# Patient Record
Sex: Male | Born: 1970
Health system: Southern US, Community
[De-identification: ages and names within clinical notes are randomized; demographics above are authoritative.]

## PROBLEM LIST (undated history)

## (undated) ENCOUNTER — Ambulatory Visit: Admission: EM | Payer: 59 | Source: Home / Self Care

## (undated) DIAGNOSIS — F419 Anxiety disorder, unspecified: Secondary | ICD-10-CM

## (undated) HISTORY — DX: Anxiety disorder, unspecified: F41.9

---
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
## 1999-11-03 ENCOUNTER — Emergency Department (HOSPITAL_COMMUNITY): Admission: EM | Admit: 1999-11-03 | Discharge: 1999-11-03 | Payer: Self-pay | Admitting: Emergency Medicine

## 1999-11-03 ENCOUNTER — Encounter: Payer: Self-pay | Admitting: Emergency Medicine

## 2011-08-22 ENCOUNTER — Ambulatory Visit: Payer: PRIVATE HEALTH INSURANCE

## 2011-08-22 ENCOUNTER — Ambulatory Visit (INDEPENDENT_AMBULATORY_CARE_PROVIDER_SITE_OTHER): Payer: PRIVATE HEALTH INSURANCE | Admitting: Family Medicine

## 2011-08-22 VITALS — BP 125/84 | HR 76 | Temp 98.6°F | Resp 18 | Ht 72.0 in | Wt 214.0 lb

## 2011-08-22 DIAGNOSIS — M25561 Pain in right knee: Secondary | ICD-10-CM

## 2011-08-22 DIAGNOSIS — M2391 Unspecified internal derangement of right knee: Secondary | ICD-10-CM

## 2011-08-22 DIAGNOSIS — M2390 Unspecified internal derangement of unspecified knee: Secondary | ICD-10-CM
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
## 2012-08-24 ENCOUNTER — Ambulatory Visit (INDEPENDENT_AMBULATORY_CARE_PROVIDER_SITE_OTHER): Payer: PRIVATE HEALTH INSURANCE | Admitting: Emergency Medicine

## 2012-08-24 VITALS — BP 138/90 | HR 71 | Temp 98.3°F | Resp 16 | Ht 73.0 in | Wt 209.0 lb

## 2012-08-24 DIAGNOSIS — G5680 Other specified mononeuropathies of unspecified upper limb: Secondary | ICD-10-CM

## 2012-08-24 DIAGNOSIS — G5682 Other specified mononeuropathies of left upper limb: Secondary | ICD-10-CM

## 2012-08-24 MED ORDER — HYDROCODONE-ACETAMINOPHEN 5-325 MG PO TABS: 1.0000 | ORAL_TABLET | ORAL | Status: DC | PRN

## 2012-08-24 MED ORDER — NAPROXEN SODIUM 550 MG PO TABS: 550.0000 mg | ORAL_TABLET | Freq: Two times a day (BID) | ORAL | Status: AC

## 2012-08-24 MED ORDER — CYCLOBENZAPRINE HCL 10 MG PO TABS: 10.0000 mg | ORAL_TABLET | Freq: Three times a day (TID) | ORAL | Status: DC | PRN

## 2012-08-24 NOTE — Progress Notes (Signed)
[facility] [address], [place] [address] [phone]  Date:  08/24/2012   Name:  Arthur Taylor   DOB:  [birth year]   MRN:  [id number]  PCP:  No primary provider on file.    Chief Complaint: Back Pain   History of Present Illness:  Arthur Taylor is a 42 y.o. very pleasant male patient who presents with the following:  Pain in left upper back medial to inferior pole scapula.  No history of injury. Developed after pulling up a floor and laying floor tile.  Denies shortness of breath or cough.  No fever or chills.  No hemoptysis.  Pain for two weeks.  Pain worse with deep breathing, sneezing or coughing.  No improvement with over the counter medications or other home remedies. Denies other complaint or health concern today.   There is no problem list on file for this patient.   No past medical history on file.  No past surgical history on file.  History  Substance Use Topics  . Smoking status: Passive Smoke Exposure - Never Smoker  . Smokeless tobacco: Not on file  . Alcohol Use: No    No family history on file.  No Known Allergies  Medication list has been reviewed and updated.  No current outpatient prescriptions on file prior to visit.   No current facility-administered medications on file prior to visit.    Review of Systems:  As per HPI, otherwise negative.    Physical Examination: Filed Vitals:   08/24/12 1206  BP: 138/90  Pulse: 71  Temp: 98.3 F (36.8 C)  Resp: 16   Filed Vitals:   08/24/12 1206  Height: 6\' 1"  (1.854 m)  Weight: 209 lb (94.802 kg)   Body mass index is 27.58 kg/(m^2). Ideal Body Weight: Weight in (lb) to have BMI = 25: 189.1  GEN: WDWN, NAD, Non-toxic, A & O x 3 HEENT: Atraumatic, Normocephalic. Neck supple. No masses, No LAD. Ears and Nose: No external deformity. CV: RRR, No M/G/R. No JVD. No thrill. No extra heart sounds. PULM: CTA B, no wheezes, crackles, rhonchi. No retractions. No resp.  distress. No accessory muscle use. ABD: S, NT, ND, +BS. No rebound. No HSM. EXTR: No c/c/e NEURO Normal gait.  PSYCH: Normally interactive. Conversant. Not depressed or anxious appearing.  Calm demeanor.  BACK:  Tender trigger point medial to scapula   Assessment and Plan: Scapulocostal syndrome Anaprox Flexeril vicodin Local heat Follow up as needed Signed,  Phillips Odor, MD

## 2012-08-24 NOTE — Patient Instructions (Addendum)
Back Pain, Adult Low back pain is very common. About 1 in 5 people have back pain.The cause of low back pain is rarely dangerous. The pain often gets better over time.About half of people with a sudden onset of back pain feel better in just 2 weeks. About 8 in 10 people feel better by 6 weeks.  CAUSES Some common causes of back pain include:  Strain of the muscles or ligaments supporting the spine.  Wear and tear (degeneration) of the spinal discs.  Arthritis.  Direct injury to the back. DIAGNOSIS Most of the time, the direct cause of low back pain is not known.However, back pain can be treated effectively even when the exact cause of the pain is unknown.Answering your caregiver's questions about your overall health and symptoms is one of the most accurate ways to make sure the cause of your pain is not dangerous. If your caregiver needs more information, he or she may order lab work or imaging tests (X-rays or MRIs).However, even if imaging tests show changes in your back, this usually does not require surgery. HOME CARE INSTRUCTIONS For many people, back pain returns.Since low back pain is rarely dangerous, it is often a condition that people can learn to manageon their own.   Remain active. It is stressful on the back to sit or stand in one place. Do not sit, drive, or stand in one place for more than 30 minutes at a time. Take short walks on level surfaces as soon as pain allows.Try to increase the length of time you walk each day.  Do not stay in bed.Resting more than 1 or 2 days can delay your recovery.  Do not avoid exercise or work.Your body is made to move.It is not dangerous to be active, even though your back may hurt.Your back will likely heal faster if you return to being active before your pain is gone.  Pay attention to your body when you bend and lift. Many people have less discomfortwhen lifting if they bend their knees, keep the load close to their bodies,and  avoid twisting. Often, the most comfortable positions are those that put less stress on your recovering back.  Find a comfortable position to sleep. Use a firm mattress and lie on your side with your knees slightly bent. If you lie on your back, put a pillow under your knees.  Only take over-the-counter or prescription medicines as directed by your caregiver. Over-the-counter medicines to reduce pain and inflammation are often the most helpful.Your caregiver may prescribe muscle relaxant drugs.These medicines help dull your pain so you can more quickly return to your normal activities and healthy exercise.  Put ice on the injured area.  Put ice in a plastic bag.  Place a towel between your skin and the bag.  Leave the ice on for 15 to 20 minutes, 3 to 4 times a day for the first 2 to 3 days. After that, ice and heat may be alternated to reduce pain and spasms.  Ask your caregiver about trying back exercises and gentle massage. This may be of some benefit.  Avoid feeling anxious or stressed.Stress increases muscle tension and can worsen back pain.It is important to recognize when you are anxious or stressed and learn ways to manage it.Exercise is a great option. SEEK MEDICAL CARE IF:  You have pain that is not relieved with rest or medicine.  You have pain that does not improve in 1 week.  You have new symptoms.  You are generally   not feeling well. SEEK IMMEDIATE MEDICAL CARE IF:   You have pain that radiates from your back into your legs.  You develop new bowel or bladder control problems.  You have unusual weakness or numbness in your arms or legs.  You develop nausea or vomiting.  You develop abdominal pain.  You feel faint. Document Released: 05/16/2005 Document Revised: 11/15/2011 Document Reviewed: 10/04/2010 ExitCare Patient Information 2013 [employer].  

## 2014-03-17 ENCOUNTER — Ambulatory Visit (INDEPENDENT_AMBULATORY_CARE_PROVIDER_SITE_OTHER): Payer: BC Managed Care – PPO | Admitting: Family Medicine

## 2014-03-17 VITALS — BP 120/78 | HR 62 | Temp 98.2°F | Resp 16 | Ht 72.75 in | Wt 203.0 lb

## 2014-03-17 DIAGNOSIS — R05 Cough: Secondary | ICD-10-CM

## 2014-03-17 DIAGNOSIS — R059 Cough, unspecified: Secondary | ICD-10-CM

## 2014-03-17 DIAGNOSIS — J069 Acute upper respiratory infection, unspecified: Secondary | ICD-10-CM

## 2014-03-17 DIAGNOSIS — R0982 Postnasal drip: Secondary | ICD-10-CM

## 2014-03-17 MED ORDER — FLUTICASONE PROPIONATE 50 MCG/ACT NA SUSP: 2.0000 | Freq: Every day | NASAL | Status: DC

## 2014-03-17 MED ORDER — BENZONATATE 100 MG PO CAPS: 200.0000 mg | ORAL_CAPSULE | Freq: Two times a day (BID) | ORAL | Status: DC | PRN

## 2014-03-17 MED ORDER — AZITHROMYCIN 250 MG PO TABS: ORAL_TABLET | ORAL | Status: DC

## 2014-03-17 NOTE — Patient Instructions (Signed)
Upper Respiratory Infection, Adult An upper respiratory infection (URI) is also sometimes known as the common cold. The upper respiratory tract includes the nose, sinuses, throat, trachea, and bronchi. Bronchi are the airways leading to the lungs. Most people improve within 1 week, but symptoms can last up to 2 weeks. A residual cough may last even longer.  CAUSES Many different viruses can infect the tissues lining the upper respiratory tract. The tissues become irritated and inflamed and often become very moist. Mucus production is also common. A cold is contagious. You can easily spread the virus to others by oral contact. This includes kissing, sharing a glass, coughing, or sneezing. Touching your mouth or nose and then touching a surface, which is then touched by another person, can also spread the virus. SYMPTOMS  Symptoms typically develop 1 to 3 days after you come in contact with a cold virus. Symptoms vary from person to person. They may include:  Runny nose.  Sneezing.  Nasal congestion.  Sinus irritation.  Sore throat.  Loss of voice (laryngitis).  Cough.  Fatigue.  Muscle aches.  Loss of appetite.  Headache.  Low-grade fever. DIAGNOSIS  You might diagnose your own cold based on familiar symptoms, since most people get a cold 2 to 3 times a year. Your caregiver can confirm this based on your exam. Most importantly, your caregiver can check that your symptoms are not due to another disease such as strep throat, sinusitis, pneumonia, asthma, or epiglottitis. Blood tests, throat tests, and X-rays are not necessary to diagnose a common cold, but they may sometimes be helpful in excluding other more serious diseases. Your caregiver will decide if any further tests are required. RISKS AND COMPLICATIONS  You may be at risk for a more severe case of the common cold if you smoke cigarettes, have chronic heart disease (such as heart failure) or lung disease (such as asthma), or if  you have a weakened immune system. The very young and very old are also at risk for more serious infections. Bacterial sinusitis, middle ear infections, and bacterial pneumonia can complicate the common cold. The common cold can worsen asthma and chronic obstructive pulmonary disease (COPD). Sometimes, these complications can require emergency medical care and may be life-threatening. PREVENTION  The best way to protect against getting a cold is to practice good hygiene. Avoid oral or hand contact with people with cold symptoms. Wash your hands often if contact occurs. There is no clear evidence that vitamin C, vitamin E, echinacea, or exercise reduces the chance of developing a cold. However, it is always recommended to get plenty of rest and practice good nutrition. TREATMENT  Treatment is directed at relieving symptoms. There is no cure. Antibiotics are not effective, because the infection is caused by a virus, not by bacteria. Treatment may include:  Increased fluid intake. Sports drinks offer valuable electrolytes, sugars, and fluids.  Breathing heated mist or steam (vaporizer or shower).  Eating chicken soup or other clear broths, and maintaining good nutrition.  Getting plenty of rest.  Using gargles or lozenges for comfort.  Controlling fevers with ibuprofen or acetaminophen as directed by your caregiver.  Increasing usage of your inhaler if you have asthma. Zinc gel and zinc lozenges, taken in the first 24 hours of the common cold, can shorten the duration and lessen the severity of symptoms. Pain medicines may help with fever, muscle aches, and throat pain. A variety of non-prescription medicines are available to treat congestion and runny nose. Your caregiver   can make recommendations and may suggest nasal or lung inhalers for other symptoms.  HOME CARE INSTRUCTIONS   Only take over-the-counter or prescription medicines for pain, discomfort, or fever as directed by your  caregiver.  Use a warm mist humidifier or inhale steam from a shower to increase air moisture. This may keep secretions moist and make it easier to breathe.  Drink enough water and fluids to keep your urine clear or pale yellow.  Rest as needed.  Return to work when your temperature has returned to normal or as your caregiver advises. You may need to stay home longer to avoid infecting others. You can also use a face mask and careful hand washing to prevent spread of the virus. SEEK MEDICAL CARE IF:   After the first few days, you feel you are getting worse rather than better.  You need your caregiver's advice about medicines to control symptoms.  You develop chills, worsening shortness of breath, or brown or red sputum. These may be signs of pneumonia.  You develop yellow or brown nasal discharge or pain in the face, especially when you bend forward. These may be signs of sinusitis.  You develop a fever, swollen neck glands, pain with swallowing, or white areas in the back of your throat. These may be signs of strep throat. SEEK IMMEDIATE MEDICAL CARE IF:   You have a fever.  You develop severe or persistent headache, ear pain, sinus pain, or chest pain.  You develop wheezing, a prolonged cough, cough up blood, or have a change in your usual mucus (if you have chronic lung disease).  You develop sore muscles or a stiff neck. Document Released: 11/09/2000 Document Revised: 08/08/2011 Document Reviewed: 08/21/2013 ExitCare Patient Information 2015 [employer]. This information is not intended to replace advice given to you by your health care provider. Make sure you discuss any questions you have with your health care provider.  

## 2014-03-17 NOTE — Progress Notes (Signed)
Chief Complaint:  Chief Complaint  Patient presents with  . Cough    when take a breath feel like he has something in his throat, and make him coughing.    HPI: Arthur Taylor is a 43 y.o. male who is here for 1 week history of something in his throat and has a tickle in his throat. He also had a subjective fever and was sweating last week, he was still working.  He took some "horse pills" that he got from his GF. He has coughing still but it is dry. He He has very occ dark sputum. He feels he was sick last week and GF is worried he may have walkign pneumonia.  He has taking tussin. He took the "horse pills for 2.5 days" . He denis any facial pain, ear pain. He has sore throat.   History reviewed. No pertinent past medical history. Past Surgical History  Procedure Laterality Date  . Knee surgery  1988   History   Social History  . Marital Status: Single    Spouse Name: N/A    Number of Children: N/A  . Years of Education: N/A   Social History Main Topics  . Smoking status: Passive Smoke Exposure - Never Smoker  . Smokeless tobacco: None  . Alcohol Use: No  . Drug Use: No  . Sexual Activity: Yes   Other Topics Concern  . None   Social History Narrative  . None   History reviewed. No pertinent family history. No Known Allergies Prior to Admission medications   Not on File     ROS: The patient denies fevers, chills, night sweats, unintentional weight loss, chest pain, palpitations, wheezing, dyspnea on exertion, nausea, vomiting, abdominal pain, dysuria, hematuria, melena, numbness, weakness, or tingling.   All other systems have been reviewed and were otherwise negative with the exception of those mentioned in the HPI and as above.    PHYSICAL EXAM: Filed Vitals:   03/17/14 0824  BP: 120/78  Pulse: 62  Temp: 98.2 F (36.8 C)  Resp: 16   Filed Vitals:   03/17/14 0824  Height: 6' 0.75" (1.848 m)  Weight: 203 lb (92.08 kg)   Body mass index is  26.96 kg/(m^2).  General: Alert, no acute distress HEENT:  Normocephalic, atraumatic, oropharynx patent. EOMI, PERRLA. + scarring, 2 small ruptured areas in both TM, no fluid pr drainage *( he apparentlyhas ahd this for a long time) Erythematous throat, No exudates. + boggy nares, neg inus tenderness Cardiovascular:  Regular rate and rhythm, no rubs murmurs or gallops.  Radial pulse intact. No pedal edema.  Respiratory: Clear to auscultation bilaterally.  No wheezes, rales, or rhonchi.  No cyanosis, no use of accessory musculature GI: No organomegaly, abdomen is soft and non-tender, positive bowel sounds.  No masses. Skin: No rashes. Neurologic: Facial musculature symmetric. Psychiatric: Patient is appropriate throughout our interaction. Lymphatic: No cervical lymphadenopathy Musculoskeletal: Gait intact.   LABS: No results found for this or any previous visit.   EKG/XRAY:   [facility]   ASSESSMENT/PLAN: Encounter Diagnoses  Name Primary?  . Acute upper respiratory infection Yes  . Cough   . Post-nasal drip    Try flonase and tessalon perles and then if no improvement in 48 hrs may try z pack D/w pt most likely viral so sxs improvement first.  He can c/w Tussinwhich causes him to be drowsy at night F/u prn  Gross sideeffects, risk  and benefits, and alternatives of medications d/w patient. Patient is aware that all medications have potential sideeffects and we are unable to predict every sideeffect or drug-drug interaction that may occur.  Arthur Janota PHUONG, DO 03/17/2014 8:58 AM

## 2014-05-07 ENCOUNTER — Ambulatory Visit (INDEPENDENT_AMBULATORY_CARE_PROVIDER_SITE_OTHER): Payer: BC Managed Care – PPO | Admitting: Family Medicine

## 2014-05-07 VITALS — BP 122/74 | HR 79 | Temp 98.7°F | Resp 17 | Ht 72.5 in | Wt 213.0 lb

## 2014-05-07 DIAGNOSIS — R21 Rash and other nonspecific skin eruption: Secondary | ICD-10-CM

## 2014-05-07 DIAGNOSIS — L259 Unspecified contact dermatitis, unspecified cause: Secondary | ICD-10-CM

## 2014-05-07 MED ORDER — METHYLPREDNISOLONE ACETATE 80 MG/ML IJ SUSP
80.0000 mg | Freq: Once | INTRAMUSCULAR | Status: AC
  Administered 2014-05-07: 80 mg via INTRAMUSCULAR
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
## 2014-05-07 NOTE — Progress Notes (Signed)
Subjective:    Patient ID: Arthur Taylor, male    DOB: 08-Nov-1970, 43 y.o.   MRN: [id number] This chart was scribed for Meredith Staggers, MD by Jolene Provost, Medical Scribe. This patient was seen in Room 8 and the patient's care was started a 9:07 AM.  HPI There are no active problems to display for this patient.  No past medical history on file. Past Surgical History  Procedure Laterality Date  . Knee surgery  1988   No Known Allergies Prior to Admission medications   Not on File   History   Social History  . Marital Status: Single    Spouse Name: N/A    Number of Children: N/A  . Years of Education: N/A   Occupational History  . Not on file.   Social History Main Topics  . Smoking status: Never Smoker   . Smokeless tobacco: Not on file  . Alcohol Use: No  . Drug Use: No  . Sexual Activity: Yes   Other Topics Concern  . Not on file   Social History Narrative    HPI Comments: Arthur Taylor is a 43 y.o. male who presents to [facility] complaining of a rash that started yesterday morning. Pt states he spent the night in a hotel Monday evening, and Tuesday morning he woke up with a rash. Pt denies changes in lotions, creams, or soaps. Pt denies fever, chills, SOB, genital sores or changes, changes or lesions in mouth. Pt states he took two benadryl last night. Pt put cortisone cream on his neck last night. Pt has no past hx of DM.    Review of Systems  Constitutional: Negative for fever and chills.  Respiratory: Negative for shortness of breath.   Skin: Positive for rash.       Objective:   Physical Exam  Constitutional: He is oriented to person, place, and time. He appears well-developed and well-nourished.  HENT:  Head: Normocephalic and atraumatic.  Mouth/Throat: Oropharynx is clear and moist. No oropharyngeal exudate, posterior oropharyngeal edema or posterior oropharyngeal erythema.  No lesions in mouth.  Eyes: Pupils are equal, round, and reactive to  light.  Neck: No JVD present.  Cardiovascular: Normal rate and regular rhythm.   Pulmonary/Chest: Effort normal and breath sounds normal. No respiratory distress.  Neurological: He is alert and oriented to person, place, and time.  Skin: Skin is warm and dry. Rash noted.  Multiple scattered areas of erythematous plaques, linear in some areas, with diffuse papular lesions on abdomen and chest, greater than back. Some areas with slight excoriation.  Psychiatric: He has a normal mood and affect. His behavior is normal.  Nursing note and vitals reviewed.    Filed Vitals:   05/07/14 0817  BP: 122/74  Pulse: 79  Temp: 98.7 F (37.1 C)  TempSrc: Oral  Resp: 17  Height: 6' 0.5" (1.842 m)  Weight: 213 lb (96.616 kg)  SpO2: 97%       Assessment & Plan:   Arthur Taylor is a 43 y.o. male Contact dermatitis - Plan: methylPREDNISolone acetate (DEPO-MEDROL) injection 80 mg  Rash - Plan: methylPREDNISolone acetate (DEPO-MEDROL) injection 80 mg  -possible contact derm from bedclothes - diffuse rxn.   -depomedrol IM x 1 - SED, sx care with benadryl then zyrtec as improved.   -rtc precautions discussed   Meds ordered this encounter  Medications  . methylPREDNISolone acetate (DEPO-MEDROL) injection 80 mg    Sig:    Patient Instructions  You likely  have had a contact dermatitis to something from hotel 2 nights ago.  Shot of steroid given today, can take benadryl today if needed for itching, changes to Zyrtec once per day in next day or two as symptoms improve and continue zyrtec for 5-7 days. Return to the clinic or go to the nearest emergency room if any of your symptoms worsen or new symptoms occur.  Contact Dermatitis Contact dermatitis is a reaction to certain substances that touch the skin. Contact dermatitis can be either irritant contact dermatitis or allergic contact dermatitis. Irritant contact dermatitis does not require previous exposure to the substance for a reaction to  occur.Allergic contact dermatitis only occurs if you have been exposed to the substance before. Upon a repeat exposure, your body reacts to the substance.  CAUSES  Many substances can cause contact dermatitis. Irritant dermatitis is most commonly caused by repeated exposure to mildly irritating substances, such as:  Makeup.  Soaps.  Detergents.  Bleaches.  Acids.  Metal salts, such as nickel. Allergic contact dermatitis is most commonly caused by exposure to:  Poisonous plants.  Chemicals (deodorants, shampoos).  Jewelry.  Latex.  Neomycin in triple antibiotic cream.  Preservatives in products, including clothing. SYMPTOMS  The area of skin that is exposed may develop:  Dryness or flaking.  Redness.  Cracks.  Itching.  Pain or a burning sensation.  Blisters. With allergic contact dermatitis, there may also be swelling in areas such as the eyelids, mouth, or genitals.  DIAGNOSIS  Your caregiver can usually tell what the problem is by doing a physical exam. In cases where the cause is uncertain and an allergic contact dermatitis is suspected, a patch skin test may be performed to help determine the cause of your dermatitis. TREATMENT Treatment includes protecting the skin from further contact with the irritating substance by avoiding that substance if possible. Barrier creams, powders, and gloves may be helpful. Your caregiver may also recommend:  Steroid creams or ointments applied 2 times daily. For best results, soak the rash area in cool water for 20 minutes. Then apply the medicine. Cover the area with a plastic wrap. You can store the steroid cream in the refrigerator for a "chilly" effect on your rash. That may decrease itching. Oral steroid medicines may be needed in more severe cases.  Antibiotics or antibacterial ointments if a skin infection is present.  Antihistamine lotion or an antihistamine taken by mouth to ease itching.  Lubricants to keep  moisture in your skin.  Burow's solution to reduce redness and soreness or to dry a weeping rash. Mix one packet or tablet of solution in 2 cups cool water. Dip a clean washcloth in the mixture, wring it out a bit, and put it on the affected area. Leave the cloth in place for 30 minutes. Do this as often as possible throughout the day.  Taking several cornstarch or baking soda baths daily if the area is too large to cover with a washcloth. Harsh chemicals, such as alkalis or acids, can cause skin damage that is like a burn. You should flush your skin for 15 to 20 minutes with cold water after such an exposure. You should also seek immediate medical care after exposure. Bandages (dressings), antibiotics, and pain medicine may be needed for severely irritated skin.  HOME CARE INSTRUCTIONS  Avoid the substance that caused your reaction.  Keep the area of skin that is affected away from hot water, soap, sunlight, chemicals, acidic substances, or anything else that would irritate  your skin.  Do not scratch the rash. Scratching may cause the rash to become infected.  You may take cool baths to help stop the itching.  Only take over-the-counter or prescription medicines as directed by your caregiver.  See your caregiver for follow-up care as directed to make sure your skin is healing properly. SEEK MEDICAL CARE IF:   Your condition is not better after 3 days of treatment.  You seem to be getting worse.  You see signs of infection such as swelling, tenderness, redness, soreness, or warmth in the affected area.  You have any problems related to your medicines. Document Released: 05/13/2000 Document Revised: 08/08/2011 Document Reviewed: 10/19/2010 [facility] Patient Information 2015 [place], [place]. This information is not intended to replace advice given to you by your health care provider. Make sure you discuss any questions you have with your health care provider.     I personally performed  the services described in this documentation, which was scribed in my presence. The recorded information has been reviewed and considered, and addended by me as needed.

## 2015-04-03 ENCOUNTER — Ambulatory Visit: Payer: 59

## 2016-01-19 ENCOUNTER — Emergency Department (HOSPITAL_COMMUNITY): Payer: Self-pay

## 2016-01-19 ENCOUNTER — Encounter (HOSPITAL_COMMUNITY): Payer: Self-pay | Admitting: Emergency Medicine

## 2016-01-19 DIAGNOSIS — R002 Palpitations: Secondary | ICD-10-CM | POA: Insufficient documentation

## 2016-01-19 DIAGNOSIS — R0789 Other chest pain: Secondary | ICD-10-CM | POA: Insufficient documentation

## 2016-01-19 DIAGNOSIS — Z7982 Long term (current) use of aspirin: Secondary | ICD-10-CM | POA: Insufficient documentation

## 2016-01-19 DIAGNOSIS — R55 Syncope and collapse: Secondary | ICD-10-CM | POA: Insufficient documentation

## 2016-01-19 LAB — BASIC METABOLIC PANEL
Anion gap: 5 (ref 5–15)
BUN: 18 mg/dL (ref 6–20)
CHLORIDE: 108 mmol/L (ref 101–111)
CO2: 24 mmol/L (ref 22–32)
Calcium: 9.6 mg/dL (ref 8.9–10.3)
Creatinine, Ser: 0.89 mg/dL (ref 0.61–1.24)
GFR calc non Af Amer: >60 mL/min (ref >60)
Glucose, Bld: 118 mg/dL — ABNORMAL HIGH (ref 65–99)
POTASSIUM: 4 mmol/L (ref 3.5–5.1)
SODIUM: 137 mmol/L (ref 135–145)

## 2016-01-19 LAB — I-STAT TROPONIN, ED: Troponin i, poc: 0 ng/mL (ref 0.00–0.08)

## 2016-01-19 LAB — CBC
HEMATOCRIT: 45 % (ref 39.0–52.0)
Hemoglobin: 15.2 g/dL (ref 13.0–17.0)
MCH: 29.7 pg (ref 26.0–34.0)
MCHC: 33.8 g/dL (ref 30.0–36.0)
MCV: 87.9 fL (ref 78.0–100.0)
Platelets: 213 10*3/uL (ref 150–400)
RBC: 5.12 MIL/uL (ref 4.22–5.81)
RDW: 12.6 % (ref 11.5–15.5)
WBC: 10.1 10*3/uL (ref 4.0–10.5)

## 2016-01-19 NOTE — ED Triage Notes (Signed)
Pt. reports palpitations , lightheaded, chest discomfort and nausea onset this afternoon . Denies chest pain /respirations unlabored.

## 2016-01-20 ENCOUNTER — Emergency Department (HOSPITAL_COMMUNITY)
Admission: EM | Admit: 2016-01-20 | Discharge: 2016-01-20 | Disposition: A | Discharge status: Home / Self Care | Payer: Self-pay | Attending: Emergency Medicine | Admitting: Emergency Medicine

## 2016-01-20 DIAGNOSIS — R079 Chest pain, unspecified: Secondary | ICD-10-CM

## 2016-01-20 DIAGNOSIS — R55 Syncope and collapse: Secondary | ICD-10-CM

## 2016-01-20 DIAGNOSIS — R002 Palpitations: Secondary | ICD-10-CM

## 2016-01-20 LAB — I-STAT TROPONIN, ED: Troponin i, poc: 0 ng/mL (ref 0.00–0.08)

## 2016-01-20 NOTE — ED Provider Notes (Signed)
[facility]-EMERGENCY DEPT Provider Note   CSN: [id number] Arrival date & time: 01/19/16  2015  By signing my name below, I, Arthur Taylor, attest that this documentation has been prepared under the direction and in the presence of Arthur Creasehristopher J Pollina, MD . Electronically Signed: Majel HomerPeyton Taylor, Scribe. 01/20/2016. 1:58 AM.  History   Chief Complaint Chief Complaint  Patient presents with  . Palpitations   The history is provided by the patient. No language interpreter was used.   HPI Comments: Arthur Taylor is a 45 y.o. male who presents to the Emergency Department complaining of gradually improving, sensation of palpitations and left sided chest "pressure" that began this afternoon. Pt reports he came home early from work and experienced "heart racing and thumping fast and hard" while laying in bed; he states his facility at work does not have air conditioning. Per wife, pt has been "cold and clammy" for the past few days; pt notes associated nausea, dizziness described as pre-syncope, ligtheadedness and diaphoresis today. Pt states he initially felt like his symptoms were associated with anxiety but notes he had "nothing to be anxious about." He denies chest pain and hx and FHx of heart problems. Pt also notes he was recently prescribed Prilosec for GERD but only takes it as needed because he doesn't like taking pills everyday. He reports increased GERD symptoms over the past 3 days.   History reviewed. No pertinent past medical history.  There are no active problems to display for this patient.  Past Surgical History:  Procedure Laterality Date  . KNEE SURGERY  1988    Home Medications    Prior to Admission medications   Not on File    Family History History reviewed. No pertinent family history.  Social History Social History  Substance Use Topics  . Smoking status: Never Smoker  . Smokeless tobacco: Never Used  . Alcohol use No    Allergies   Review of patient's allergies  indicates no known allergies.  Review of Systems Review of Systems  Constitutional: Positive for diaphoresis.  Cardiovascular: Positive for chest pain and palpitations.  Gastrointestinal: Positive for nausea.  Neurological: Positive for dizziness and light-headedness.   Physical Exam Updated Vital Signs BP 119/98   Pulse 103   Temp 98.1 F (36.7 C) (Oral)   Resp 17   Ht 6' (1.829 m)   Wt 212 lb (96.2 kg)   SpO2 93%   BMI 28.75 kg/m   Physical Exam  Constitutional: He is oriented to person, place, and time. He appears well-developed and well-nourished. No distress.  HENT:  Head: Normocephalic and atraumatic.  Right Ear: Hearing normal.  Left Ear: Hearing normal.  Nose: Nose normal.  Mouth/Throat: Oropharynx is clear and moist and mucous membranes are normal.  Eyes: Conjunctivae and EOM are normal. Pupils are equal, round, and reactive to light.  Neck: Normal range of motion. Neck supple.  Cardiovascular: Regular rhythm, S1 normal and S2 normal.  Exam reveals no gallop and no friction rub.   No murmur heard. Pulmonary/Chest: Effort normal and breath sounds normal. No respiratory distress. He exhibits no tenderness.  Abdominal: Soft. Normal appearance and bowel sounds are normal. There is no hepatosplenomegaly. There is no tenderness. There is no rebound, no guarding, no tenderness at McBurney's point and negative Murphy's sign. No hernia.  Musculoskeletal: Normal range of motion.  Neurological: He is alert and oriented to person, place, and time. He has normal strength. No cranial nerve deficit or sensory deficit. Coordination normal. GCS  eye subscore is 4. GCS verbal subscore is 5. GCS motor subscore is 6.  Skin: Skin is warm, dry and intact. No rash noted. No cyanosis.  Psychiatric: He has a normal mood and affect. His speech is normal and behavior is normal. Thought content normal.  Nursing note and vitals reviewed.  ED Treatments / Results  Labs (all labs ordered are  listed, but only abnormal results are displayed) Labs Reviewed  BASIC METABOLIC PANEL - Abnormal; Notable for the following:       Result Value   Glucose, Bld 118 (*)    All other components within normal limits  CBC  I-STAT TROPOININ, ED    EKG  EKG Interpretation None      Radiology Dg Chest 2 View  Result Date: 01/19/2016 CLINICAL DATA:  Tachycardia, diaphoresis, nausea EXAM: CHEST  2 VIEW COMPARISON:  None. FINDINGS: Lungs are clear.  No pleural effusion or pneumothorax. The heart is normal in size. Visualized osseous structures are within normal limits. IMPRESSION: Normal chest radiographs. Electronically Signed   By: Charline BillsSriyesh  Krishnan M.D.   On: 01/19/2016 22:00    Procedures Procedures  DIAGNOSTIC STUDIES:  Oxygen Saturation is 93% on RA, adequate by my interpretation.    COORDINATION OF CARE:  1:58 AM Discussed treatment plan with pt at bedside and pt agreed to plan.  Medications Ordered in ED Medications - No data to display  Initial Impression / Assessment and Plan / ED Course  I have reviewed the triage vital signs and the nursing notes.  Pertinent labs & imaging results that were available during my care of the patient were reviewed by me and considered in my medical decision making (see chart for details).  Clinical Course  Value Comment By Time  DG Chest 2 View (Reviewed) Arthur Creasehristopher J Pollina, MD 08/23 0151    Patient presents to the ER for evaluation of nausea, diaphoresis, chest pain. Patient reports that he felt overheated at work today. He became acutely nauseated and had some dry heaving, followed by sweating. He went home, laid down and felt better, then got up and started to have symptoms again. At this time he also noticed that his heart was racing and he was experiencing some discomfort in the left side of his chest.  Patient has no cardiac risk factors. HEART score = 0. Patient has a normal EKG. Troponin is negative 2. Patient is felt to be  very low risk for cardiac etiology. He has have a history of GERD. He only takes Prilosec "as needed". He was counseled that this is not appropriate, if he wants to use as needed medications and H2 blocker would be more appropriate. He was told he should probably take Prilosec daily and additional antacids as needed.  I personally performed the services described in this documentation, which was scribed in my presence. The recorded information has been reviewed and is accurate.   Final Clinical Impressions(s) / ED Diagnoses   Final diagnoses:  None    New Prescriptions New Prescriptions   No medications on file     Arthur Creasehristopher J Pollina, MD 01/20/16 [phone]

## 2017-01-10 IMAGING — DX DG CHEST 2V
2 series · 2 of 2 positions shown · non-contrast
Comparison: None.

CLINICAL DATA: Tachycardia, diaphoresis, nausea

EXAM:
CHEST  2 VIEW

[w chest pa]
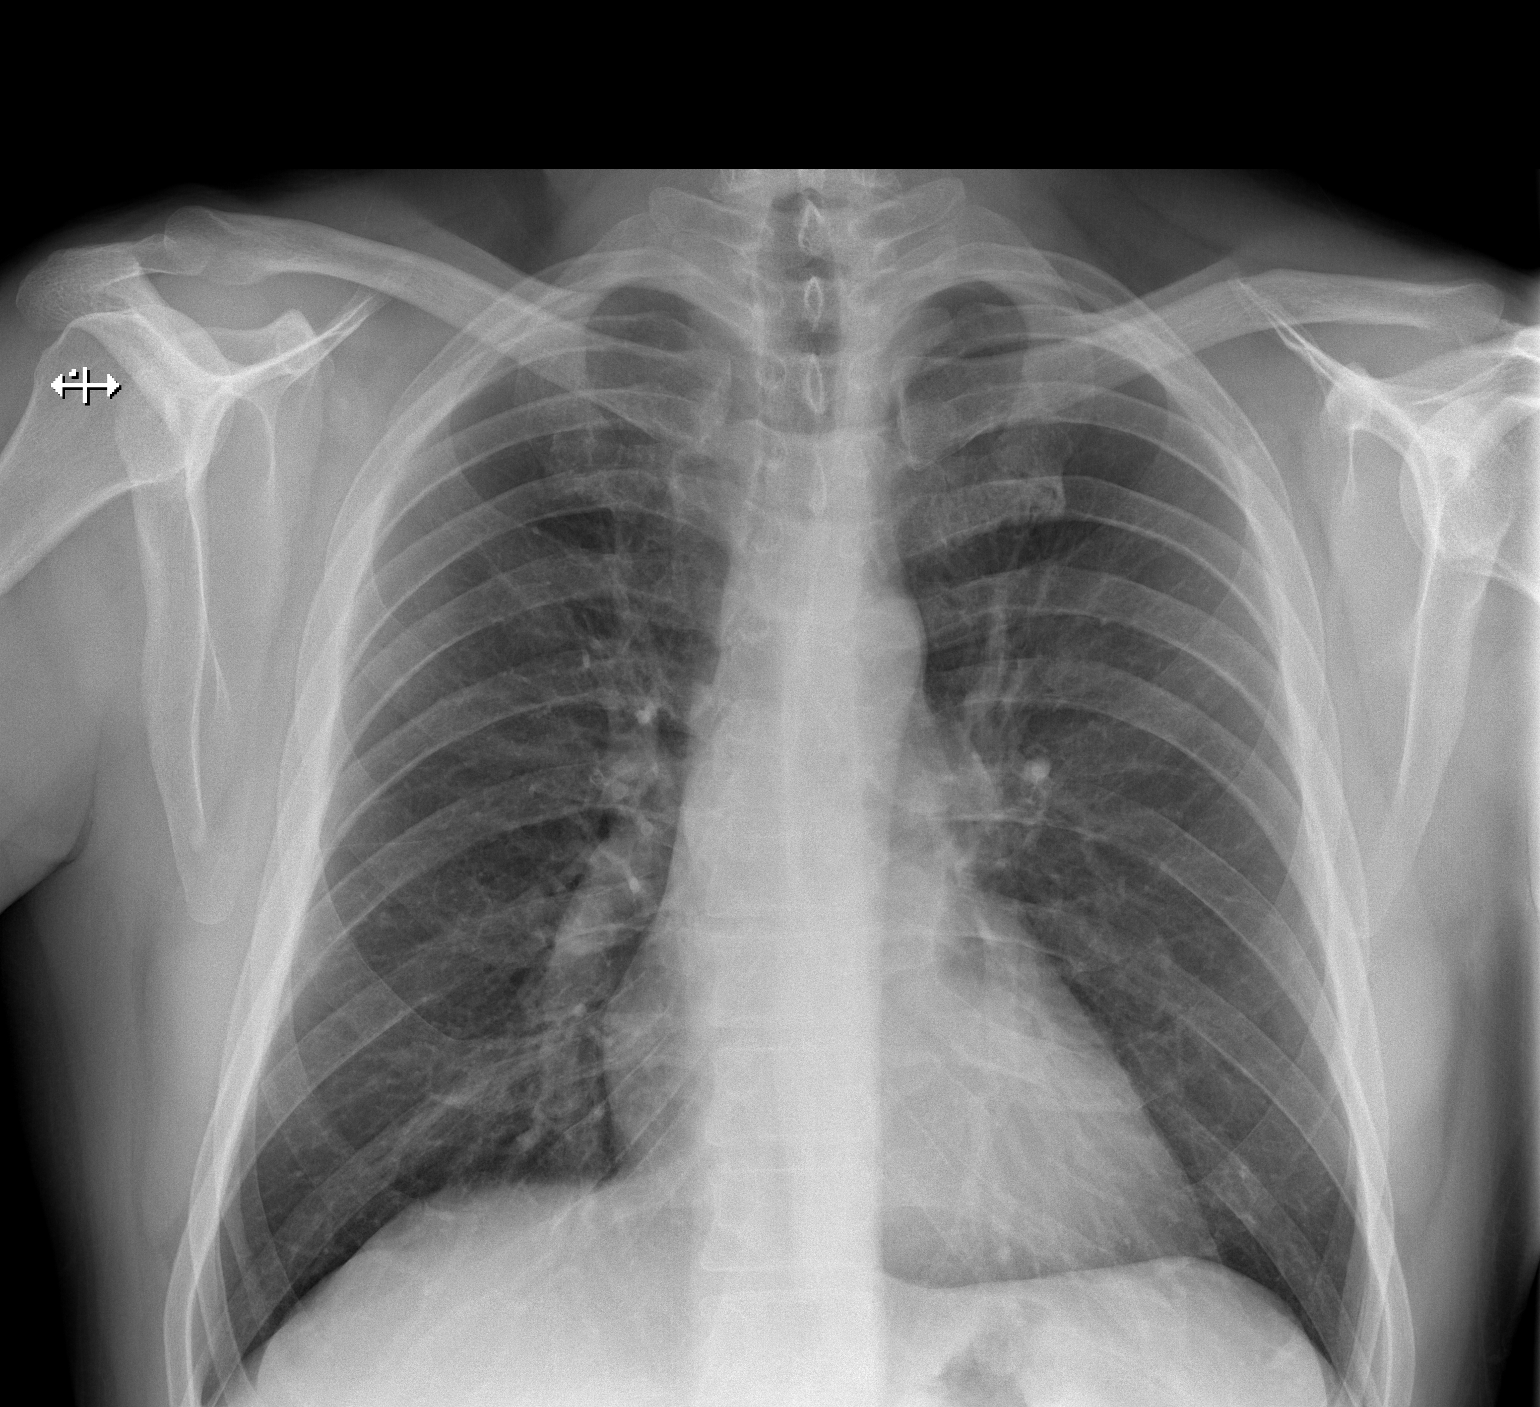

[w chest lat]
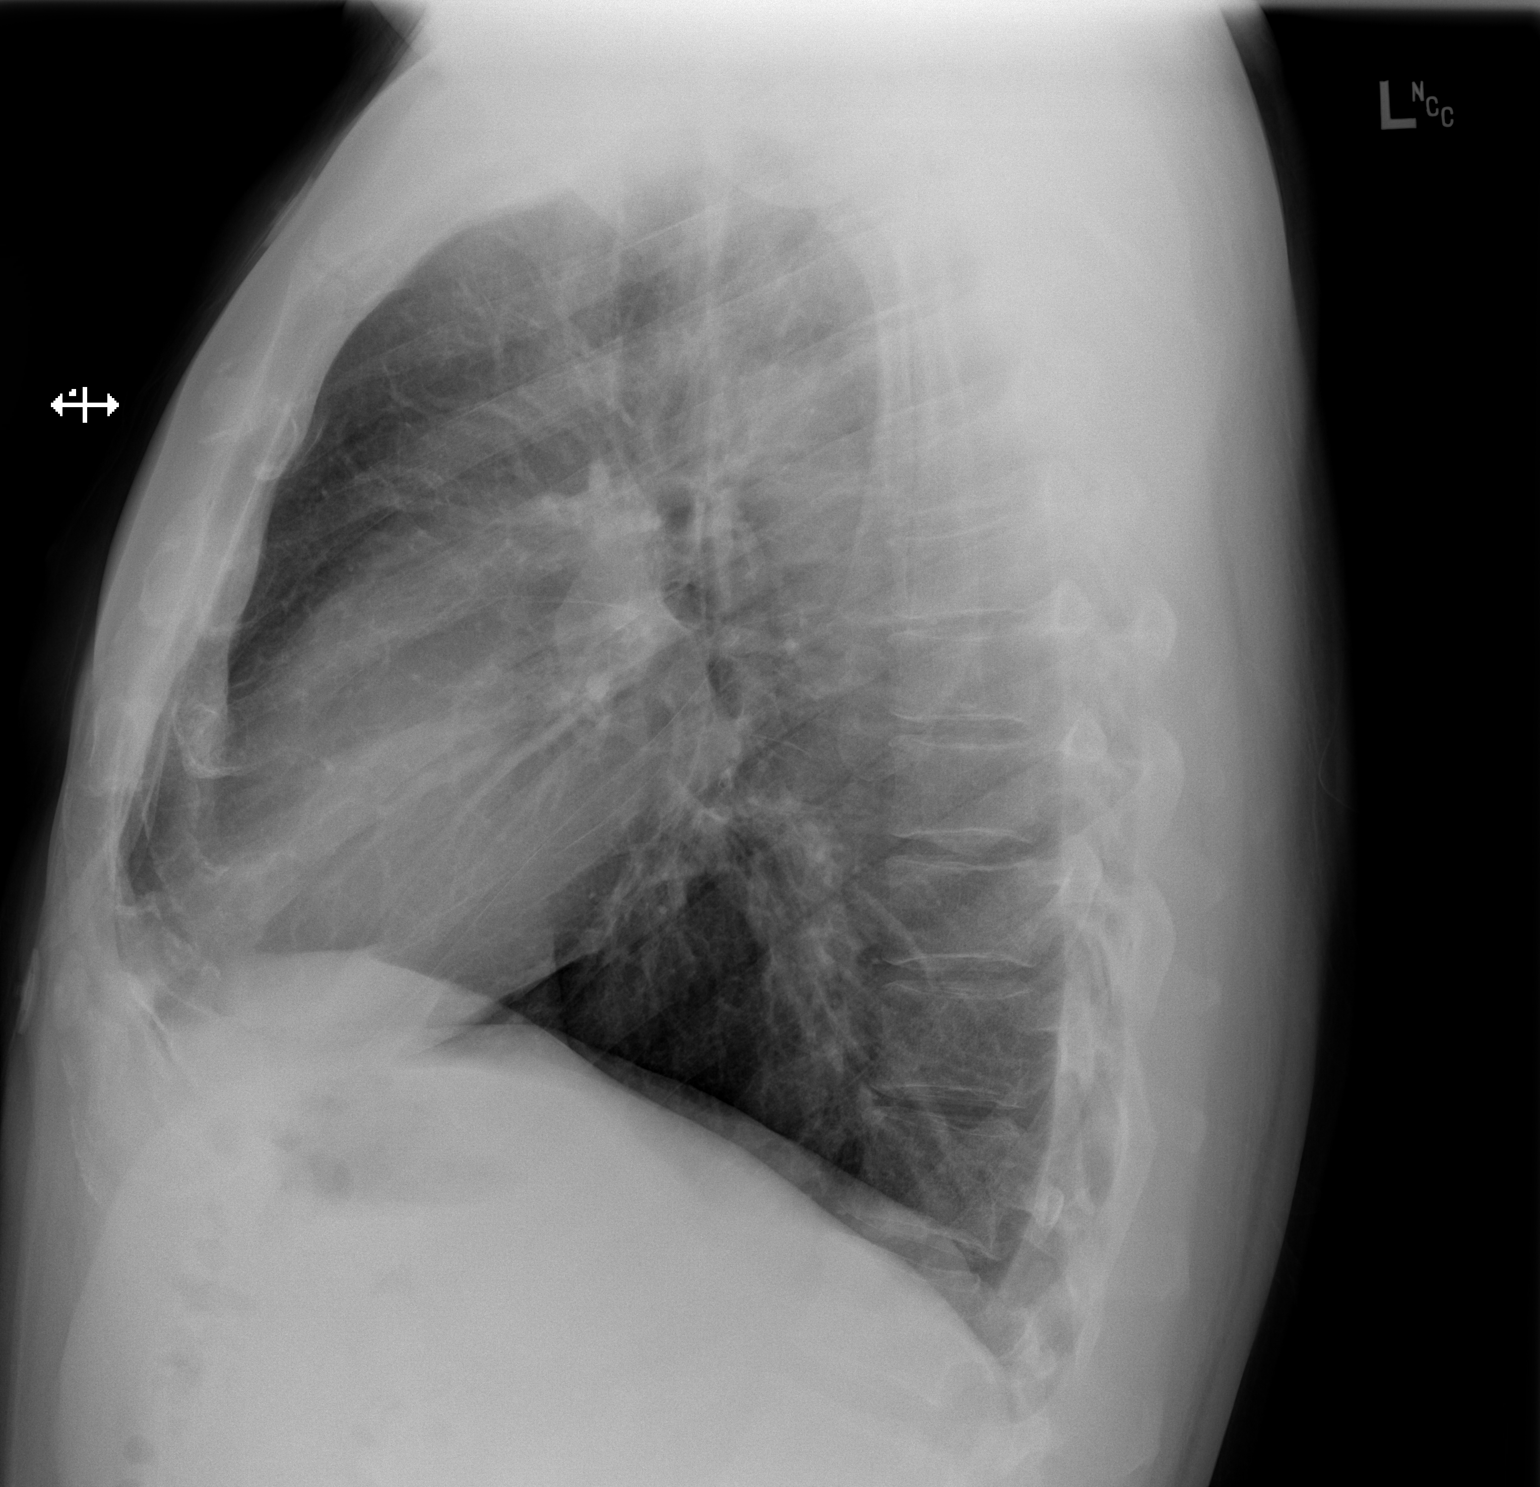

[2 of 2 positions shown; findings below may reference images not displayed]

FINDINGS: Lungs are clear.  No pleural effusion or pneumothorax.

The heart is normal in size.

Visualized osseous structures are within normal limits.
IMPRESSION: Normal chest radiographs.

## 2017-11-16 ENCOUNTER — Ambulatory Visit: Payer: Self-pay | Admitting: Physician Assistant

## 2018-06-14 ENCOUNTER — Ambulatory Visit
Admission: EM | Admit: 2018-06-14 | Discharge: 2018-06-14 | Disposition: A | Discharge status: Home / Self Care | Payer: BLUE CROSS/BLUE SHIELD | Attending: Physician Assistant | Admitting: Physician Assistant

## 2018-06-14 DIAGNOSIS — H02823 Cysts of right eye, unspecified eyelid: Secondary | ICD-10-CM | POA: Insufficient documentation

## 2018-06-14 MED ORDER — DOXYCYCLINE HYCLATE 100 MG PO CAPS: 100.0000 mg | ORAL_CAPSULE | Freq: Two times a day (BID) | ORAL | 0 refills | Status: DC

## 2018-06-14 NOTE — ED Provider Notes (Signed)
[name]    CSN: [id number] Arrival date & time: 06/14/18  1357     History   Chief Complaint Chief Complaint  Patient presents with  . Facial Swelling    HPI Arthur Taylor is a 48 y.o. male.   The history is provided by the patient. No language interpreter was used.  Rash  Location:  Face Facial rash location:  R eyelid Quality: swelling   Severity:  Moderate Onset quality:  Gradual Timing:  Constant Progression:  Worsening Chronicity:  New Relieved by:  Nothing Worsened by:  Nothing Ineffective treatments:  None tried Pt reports he has swelling over his eyelid.    History reviewed. No pertinent past medical history.  There are no active problems to display for this patient.   Past Surgical History:  Procedure Laterality Date  . KNEE SURGERY  1988       Home Medications    Prior to Admission medications   Medication Sig Start Date End Date Taking? Authorizing Provider  aspirin 325 MG tablet Take 650 mg by mouth every 6 (six) hours as needed for mild pain.    [provider]  doxycycline (VIBRAMYCIN) 100 MG capsule Take 1 capsule (100 mg total) by mouth 2 (two) times daily. 06/14/18   Elson Areas, PA-C  omeprazole (PRILOSEC) 20 MG capsule Take 20 mg by mouth daily as needed (ACID REFLUX).    [provider]    Family History No family history on file.  Social History Social History   Tobacco Use  . Smoking status: Never Smoker  . Smokeless tobacco: Never Used  Substance Use Topics  . Alcohol use: No    Alcohol/week: 0.0 standard drinks  . Drug use: No     Allergies   Lactose intolerance (gi)   Review of Systems Review of Systems  Skin: Positive for rash.  All other systems reviewed and are negative.    Physical Exam Triage Vital Signs ED Triage Vitals  Enc Vitals Group     BP 06/14/18 1408 138/88     Pulse Rate 06/14/18 1408 64     Resp 06/14/18 1408 18     Temp 06/14/18 1408 97.6 F  (36.4 C)     Temp Source 06/14/18 1408 Oral     SpO2 06/14/18 1408 96 %     Weight --      Height --      Head Circumference --      Peak Flow --      Pain Score 06/14/18 1409 0     Pain Loc --      Pain Edu? --      Excl. in GC? --    No data found.  Updated Vital Signs BP 138/88 (BP Location: Left Arm)   Pulse 64   Temp 97.6 F (36.4 C) (Oral)   Resp 18   SpO2 96%   Visual Acuity Right Eye Distance:   Left Eye Distance:   Bilateral Distance:    Right Eye Near:   Left Eye Near:    Bilateral Near:     Physical Exam Vitals signs and nursing note reviewed.  Constitutional:      Appearance: Normal appearance. He is well-developed.  HENT:     Head: Normocephalic and atraumatic.     Nose: Nose normal.     Mouth/Throat:     Mouth: Mucous membranes are moist.  Eyes:     Conjunctiva/sclera: Conjunctivae normal.  Comments: Swollen area corner of right eyelid, scabbed, appears to have been draining.   Neck:     Musculoskeletal: Neck supple.  Cardiovascular:     Rate and Rhythm: Normal rate and regular rhythm.     Heart sounds: No murmur.  Pulmonary:     Effort: Pulmonary effort is normal. No respiratory distress.     Breath sounds: Normal breath sounds.  Abdominal:     Palpations: Abdomen is soft.     Tenderness: There is no abdominal tenderness.  Skin:    General: Skin is warm and dry.  Neurological:     Mental Status: He is alert.      UC Treatments / Results  Labs (all labs ordered are listed, but only abnormal results are displayed) Labs Reviewed - No data to display  EKG None  Radiology No results found.  Procedures Procedures (including critical care time)  Medications Ordered in UC Medications - No data to display  Initial Impression / Assessment and Plan / UC Course  I have reviewed the triage vital signs and the nursing notes.  Pertinent labs & imaging results that were available during my care of the patient were reviewed by me and  considered in my medical decision making (see chart for details).     I offered I and D.  Pt would prefer to have  Surgical removal of area.  I counseled him on options.  Pt given referral to Plastics and dermatology.  Pt advised to soak area,  Final Clinical Impressions(s) / UC Diagnoses   Final diagnoses:  Eyelid cyst, right     Discharge Instructions     Warm compresses.  Take antibiotic.  Schedule to see Plastic Surgeon or Dermatologist for removal    ED Prescriptions    Medication Sig Dispense Auth. Provider   doxycycline (VIBRAMYCIN) 100 MG capsule Take 1 capsule (100 mg total) by mouth 2 (two) times daily. 20 capsule Elson Areas, [place]     Controlled Substance Prescriptions [place] Controlled Substance Registry consulted? Not Applicable  An After Visit Summary was printed and given to the patient.    Elson Areas, [place] 06/14/18 1500

## 2018-06-14 NOTE — Discharge Instructions (Addendum)
Warm compresses.  Take antibiotic.  Schedule to see Plastic Surgeon or Dermatologist for removal

## 2018-06-14 NOTE — ED Triage Notes (Signed)
Pt c/o an old zit to rt eye lid for over 87yrs and never had it removed. C/o swelling to that area today and would like it cut out.

## 2018-06-15 ENCOUNTER — Ambulatory Visit (INDEPENDENT_AMBULATORY_CARE_PROVIDER_SITE_OTHER): Payer: BLUE CROSS/BLUE SHIELD | Admitting: Plastic Surgery

## 2018-06-15 ENCOUNTER — Encounter: Payer: Self-pay | Admitting: Plastic Surgery

## 2018-06-15 VITALS — BP 121/78 | HR 64 | Temp 98.0°F | Ht 72.5 in | Wt 213.0 lb

## 2018-06-15 DIAGNOSIS — L723 Sebaceous cyst: Secondary | ICD-10-CM

## 2018-06-15 DIAGNOSIS — B079 Viral wart, unspecified: Secondary | ICD-10-CM | POA: Insufficient documentation

## 2018-06-15 NOTE — Progress Notes (Signed)
     Patient ID: Arthur Taylor, male    DOB: 1970-11-08, 48 y.o.   MRN: [id number]   Chief Complaint  Patient presents with  . Advice Only    for cyst above (R) eye    The patient is a 48 year old male here for evaluation of a mass on his right lateral periorbital area.  The patient states it is been there for years.  In the past few weeks it has gotten very tender and swollen.  He is concerned about the change.  He smokes but not with tobacco.  He is otherwise healthy and has not had any recent illnesses.  He has not had any skin lesions like this in the past that he can remember.  Nothing seems to make it better.  It is 1.5 cm in size on the lateral portion of his right brow.  It has a little bit of a scab at the center most prominent aspect.  It does not appear to be infected at this time.  It is movable.  Only slightly tender.   Review of Systems  Constitutional: Negative.  Negative for activity change and appetite change.  HENT: Negative.   Eyes: Negative.   Respiratory: Negative.   Gastrointestinal: Negative.   Endocrine: Negative.   Genitourinary: Negative.   Musculoskeletal: Negative.   Skin: Negative for color change and wound.    History reviewed. No pertinent past medical history.  Past Surgical History:  Procedure Laterality Date  . KNEE SURGERY  1988      Current Outpatient Medications:  .  doxycycline (VIBRAMYCIN) 100 MG capsule, Take 1 capsule (100 mg total) by mouth 2 (two) times daily., Disp: 20 capsule, Rfl: 0 .  omeprazole (PRILOSEC) 20 MG capsule, Take 20 mg by mouth daily as needed (ACID REFLUX)., Disp: , Rfl:    Objective:   Vitals:   06/15/18 1022  BP: 121/78  Pulse: 64  Temp: 98 F (36.7 C)  SpO2: 97%    Physical Exam Vitals signs and nursing note reviewed.  Constitutional:      Appearance: Normal appearance.  HENT:     Head: Normocephalic and atraumatic.     Nose: Nose normal.  Eyes:     Extraocular Movements: Extraocular movements  intact.  Cardiovascular:     Rate and Rhythm: Normal rate.  Pulmonary:     Effort: Pulmonary effort is normal. No respiratory distress.  Abdominal:     General: Abdomen is flat. There is no distension.     Tenderness: There is no abdominal tenderness.  Musculoskeletal:        General: No swelling or deformity.  Psychiatric:        Mood and Affect: Mood normal.        Thought Content: Thought content normal.        Judgment: Judgment normal.     Assessment & Plan:  Sebaceous cyst Recommend excision of right periorbital sebaceous cyst.  We discussed if there was any concern we would send it to pathology.  He is in agreement.  He would like it to be done on a Friday so he does not have to miss any work.  Alena Bills , DO

## 2018-06-16 ENCOUNTER — Ambulatory Visit: Payer: 59 | Admitting: Family Medicine

## 2018-08-03 ENCOUNTER — Ambulatory Visit: Payer: BLUE CROSS/BLUE SHIELD | Admitting: Plastic Surgery

## 2018-08-03 ENCOUNTER — Other Ambulatory Visit (HOSPITAL_COMMUNITY)
Admission: RE | Admit: 2018-08-03 | Discharge: 2018-08-03 | Disposition: A | Discharge status: Home / Self Care | Payer: BLUE CROSS/BLUE SHIELD | Source: Ambulatory Visit | Attending: Plastic Surgery | Admitting: Plastic Surgery

## 2018-08-03 ENCOUNTER — Encounter: Payer: Self-pay | Admitting: Plastic Surgery

## 2018-08-03 VITALS — BP 142/86 | HR 60 | Temp 97.6°F | Ht 72.0 in | Wt 209.0 lb

## 2018-08-03 DIAGNOSIS — L723 Sebaceous cyst: Secondary | ICD-10-CM | POA: Insufficient documentation

## 2018-08-03 NOTE — Progress Notes (Signed)
Preoperative Dx: right lateral eyelid cyst  Postoperative Dx: Same  Procedure: excision of 1 cm right lateral eyelid cyst  Surgeon: Dr. Alan Ripper Johathon Overturf  Anesthesia: Lidocaine 1% with 1:100,000 epinepherine  Indication for Procedure: cyst  Description of Procedure: Risks and complications were explained to the patient.  Consent was confirmed.  Time out was called and all information was confirmed to be correct.  The area was prepped with betadine and drapped.  Lidocaine 1% with epinepherine was injected in the subcutaneous area.  After waiting several minutes for the lidocaine to take affect a #15 blade was used to excise the area in an eliptical pattern 1 cm.  A 6-0 Monocryl was used to close the deep layers with simple interrupted stitches.  The skin edges were reapproximated with 6-0 Monocryl subcuticular running closure.  Steri strips were applied.  The patient is to follow up in one week.  He tolerated the procedure well and there were no complications. The specimen was sent to pathology.

## 2018-08-17 ENCOUNTER — Ambulatory Visit (INDEPENDENT_AMBULATORY_CARE_PROVIDER_SITE_OTHER): Payer: BLUE CROSS/BLUE SHIELD | Admitting: Plastic Surgery

## 2018-08-17 ENCOUNTER — Other Ambulatory Visit: Payer: Self-pay

## 2018-08-17 ENCOUNTER — Encounter: Payer: Self-pay | Admitting: Plastic Surgery

## 2018-08-17 VITALS — BP 133/87 | HR 60 | Temp 97.6°F | Ht 72.5 in | Wt 202.0 lb

## 2018-08-17 DIAGNOSIS — B079 Viral wart, unspecified: Secondary | ICD-10-CM

## 2018-08-17 NOTE — Progress Notes (Signed)
   Subjective:    Patient ID: MONFORD NASCA, male    DOB: Aug 31, 1970, 48 y.o.   MRN: [id number]  Patient is a 48 year old male here for follow-up after undergoing skin excision of a lesion on the right lateral periorbital area.  The pathology came back as a verrucous cyst.  The area is healing very nicely there is no sign of infection.  Sutures are in place there is a little bit of residual swelling but as expected.   Review of Systems  Constitutional: Negative.   HENT: Negative.   Respiratory: Negative.   Musculoskeletal: Negative.   Psychiatric/Behavioral: Negative.        Objective:   Physical Exam Vitals signs and nursing note reviewed.  Constitutional:      Appearance: Normal appearance.  HENT:     Head: Normocephalic.  Cardiovascular:     Rate and Rhythm: Normal rate.  Neurological:     General: No focal deficit present.     Mental Status: He is alert.  Psychiatric:        Mood and Affect: Mood normal.        Behavior: Behavior normal.        Thought Content: Thought content normal.        Assessment & Plan:  Verrucous skin lesion Sutures removed.  Keep area clean.  Follow up as needed.

## 2019-08-13 ENCOUNTER — Encounter: Payer: Self-pay | Admitting: Registered Nurse

## 2019-08-13 ENCOUNTER — Ambulatory Visit: Payer: BLUE CROSS/BLUE SHIELD | Admitting: Registered Nurse

## 2019-08-13 ENCOUNTER — Other Ambulatory Visit: Payer: Self-pay

## 2019-08-13 VITALS — BP 161/96 | HR 75 | Temp 97.3°F | Ht 72.0 in | Wt 199.6 lb

## 2019-08-13 DIAGNOSIS — Z130 Encounter for screening for diseases of the blood and blood-forming organs and certain disorders involving the immune mechanism: Secondary | ICD-10-CM | POA: Diagnosis not present

## 2019-08-13 DIAGNOSIS — Z1329 Encounter for screening for other suspected endocrine disorder: Secondary | ICD-10-CM | POA: Diagnosis not present

## 2019-08-13 DIAGNOSIS — R0789 Other chest pain: Secondary | ICD-10-CM

## 2019-08-13 DIAGNOSIS — Z13228 Encounter for screening for other metabolic disorders: Secondary | ICD-10-CM | POA: Diagnosis not present

## 2019-08-13 DIAGNOSIS — Z1211 Encounter for screening for malignant neoplasm of colon: Secondary | ICD-10-CM

## 2019-08-13 DIAGNOSIS — Z13220 Encounter for screening for lipoid disorders: Secondary | ICD-10-CM

## 2019-08-13 DIAGNOSIS — F410 Panic disorder [episodic paroxysmal anxiety] without agoraphobia: Secondary | ICD-10-CM

## 2019-08-13 MED ORDER — BUSPIRONE HCL 7.5 MG PO TABS: 7.5000 mg | ORAL_TABLET | Freq: Three times a day (TID) | ORAL | 0 refills | Status: DC

## 2019-08-13 MED ORDER — ALPRAZOLAM 0.25 MG PO TBDP: 0.2500 mg | ORAL_TABLET | Freq: Every evening | ORAL | 0 refills | Status: DC | PRN

## 2019-08-13 NOTE — Progress Notes (Signed)
New Patient Office Visit  Subjective:  Patient ID: Arthur Taylor, male    DOB: 1970/11/06  Age: 49 y.o. MRN: [id number]  CC:  Chief Complaint  Patient presents with  . Anxiety    GAD= 10 patient feel like sometimes he feels he is having a panic attack over the last few months thats starting to get worse. per patient when he know he is having one his chest gets tight and hurt    HPI Arthur Taylor presents for visit to establish care  Concerned about apparent anxiety attacks that have been occurring with increasing frequency. Feels chest tightness, rapid heart rate, sweaty hands, mentally spiraling.  This has not happened to him in the past, they affect his ADLs, usually happen in mornings.  No further concerns at this time.  History reviewed. No pertinent past medical history.  Past Surgical History:  Procedure Laterality Date  . [place]    History reviewed. No pertinent family history.  Social History   Socioeconomic History  . Marital status: Single    Spouse name: Not on file  . Number of children: Not on file  . Years of education: Not on file  . Highest education level: Not on file  Occupational History  . Not on file  Tobacco Use  . Smoking status: Never Smoker  . Smokeless tobacco: Never Used  Substance and Sexual Activity  . Alcohol use: Yes    Alcohol/week: 3.0 standard drinks    Types: 3 Standard drinks or equivalent per week  . Drug use: Yes    Types: Marijuana  . Sexual activity: Yes  Other Topics Concern  . Not on file  Social History Narrative  . Not on file   Social Determinants of Health   Financial Resource Strain:   . Difficulty of Paying Living Expenses:   Food Insecurity:   . Worried About Charity fundraiser in the Last Year:   . Arboriculturist in the Last Year:   Transportation Needs:   . Film/video editor (Medical):   Marland Kitchen Lack of Transportation (Non-Medical):   Physical Activity:   . Days of Exercise per  Week:   . Minutes of Exercise per Session:   Stress:   . Feeling of Stress :   Social Connections:   . Frequency of Communication with Friends and Family:   . Frequency of Social Gatherings with Friends and Family:   . Attends Religious Services:   . Active Member of Clubs or Organizations:   . Attends Archivist Meetings:   Marland Kitchen Marital Status:   Intimate Partner Violence:   . Fear of Current or Ex-Partner:   . Emotionally Abused:   Marland Kitchen Physically Abused:   . Sexually Abused:     ROS Review of Systems  Constitutional: Negative.   HENT: Negative.   Eyes: Negative.   Respiratory: Negative.   Cardiovascular: Negative.   Gastrointestinal: Negative.   Endocrine: Negative.   Genitourinary: Negative.   Musculoskeletal: Negative.   Skin: Negative.   Allergic/Immunologic: Negative.   Neurological: Negative.   Hematological: Negative.   Psychiatric/Behavioral: Negative for agitation, behavioral problems, confusion, decreased concentration, dysphoric mood, hallucinations, self-injury, sleep disturbance and suicidal ideas. The patient is nervous/anxious. The patient is not hyperactive.   All other systems reviewed and are negative.   Objective:   Today's Vitals: BP (!) 161/96   Pulse 75   Temp (!) 97.3 F (36.3 C) (Temporal)   Ht 6' (  1.829 m)   Wt 199 lb 9.6 oz (90.5 kg)   SpO2 98%   BMI 27.07 kg/m   Physical Exam Vitals and nursing note reviewed.  Constitutional:      General: He is not in acute distress.    Appearance: Normal appearance. He is normal weight. He is not toxic-appearing or diaphoretic.  HENT:     Head: Normocephalic and atraumatic.  Cardiovascular:     Rate and Rhythm: Normal rate and regular rhythm.  Pulmonary:     Effort: Pulmonary effort is normal. No respiratory distress.  Neurological:     General: No focal deficit present.     Mental Status: He is alert and oriented to person, place, and time. Mental status is at baseline.  Psychiatric:         Mood and Affect: Mood normal.        Behavior: Behavior normal.        Thought Content: Thought content normal.        Judgment: Judgment normal.     Assessment & Plan:   Problem List Items Addressed This Visit    None    Visit Diagnoses    Chest tightness    -  Primary   Relevant Orders   EKG 12-Lead (Completed)   Screening for endocrine, metabolic and immunity disorder       Relevant Orders   CBC With Differential   TSH   Comprehensive metabolic panel   Hemoglobin A1c   Lipid screening       Relevant Orders   Lipid panel   Special screening for malignant neoplasms, colon       Relevant Orders   Ambulatory referral to Gastroenterology   Panic attacks       Relevant Medications   ALPRAZolam (NIRAVAM) 0.25 MG dissolvable tablet   busPIRone (BUSPAR) 7.5 MG tablet   Other Relevant Orders   Ambulatory referral to Psychology      Outpatient Encounter Medications as of 08/13/2019  Medication Sig  . omeprazole (PRILOSEC) 20 MG capsule Take 20 mg by mouth daily as needed (ACID REFLUX).  Marland Kitchen ALPRAZolam (NIRAVAM) 0.25 MG dissolvable tablet Take 1 tablet (0.25 mg total) by mouth at bedtime as needed for anxiety.  . busPIRone (BUSPAR) 7.5 MG tablet Take 1 tablet (7.5 mg total) by mouth 3 (three) times daily.   No facility-administered encounter medications on file as of 08/13/2019.    Follow-up: Return in about 1 month (around 09/13/2019) for med check, bp check.   PLAN  Refer to counseling  Buspar 7.5mg  PO tid PRN, alprazolam 0.25mg  sublingual tablet po qd prn for breakthrough anxiety  Refer to colonoscopy  Labs drawn, will follow up as warranted  ekg wnl  Follow up in 1 mo for med check - he is not interested in daily medication at this time, but if buspar is not effective, will have to consider SSRI - lexapro is good choice for him in my opinion.  Patient encouraged to call clinic with any questions, comments, or concerns.  Janeece Agee, NP

## 2019-08-13 NOTE — Patient Instructions (Addendum)
Arthur Taylor -   Randie Heinz to meet you today. I'm looking forward to working with you. In brief, here's what we accomplished today:  Anxiety: Buspirone: take 7.5mg  by mouth up to three times daily as needed for anxiety.  Alprazolam: take 0.25mg  sublingual dissolving tablet once daily as needed for breakthrough anxiety, particularly if this happens after work / at night.  Labs: CBC: blood counts Lipid panel: cholesterol CMP: liver and kidney function, electrolytes A1c: blood sugars TSH: Thyroid function  These labs should be back within 1-2 weeks. I'll let you know as soon as I do what they're looking like - if we need to make any changes, we can discuss those at that time.  Additionally, we did an EKG today. The results of this were normal and reassuring.   I'd like to follow up with you in 4-6 weeks for our anxiety. I'm optimistic that we can see improvement. Ideally, we can make this an in person visit so we can recheck your blood pressure.   Please don't hesitate to reach out with any concerns in the mean time.     If you have lab work done today you will be contacted with your lab results within the next 2 weeks.  If you have not heard from [place] then please contact us. The fastest way to get your results is to register for My Chart.   IF you received an x-ray today, you will receive an invoice from [facility]. Please contact [facility] at [phone] with questions or concerns regarding your invoice.   IF you received labwork today, you will receive an invoice from [facility]. Please contact [facility] at [phone] with questions or concerns regarding your invoice.   Our billing staff will not be able to assist you with questions regarding bills from these companies.  You will be contacted with the lab results as soon as they are available. The fastest way to get your results is to activate your My Chart account. Instructions are located on the last page of this  paperwork. If you have not heard from [place] regarding the results in 2 weeks, please contact this office.

## 2019-08-14 LAB — CBC WITH DIFFERENTIAL
Basophils Absolute: 0.1 10*3/uL (ref 0.0–0.2)
Basos: 1 %
EOS (ABSOLUTE): 0.3 10*3/uL (ref 0.0–0.4)
Eos: 4 %
Hematocrit: 49 % (ref 37.5–51.0)
Hemoglobin: 16.7 g/dL (ref 13.0–17.7)
Immature Grans (Abs): 0 10*3/uL (ref 0.0–0.1)
Immature Granulocytes: 0 %
Lymphocytes Absolute: 3 10*3/uL (ref 0.7–3.1)
Lymphs: 40 %
MCH: 30.4 pg (ref 26.6–33.0)
MCHC: 34.1 g/dL (ref 31.5–35.7)
MCV: 89 fL (ref 79–97)
Monocytes Absolute: 0.6 10*3/uL (ref 0.1–0.9)
Monocytes: 8 %
Neutrophils Absolute: 3.6 10*3/uL (ref 1.4–7.0)
Neutrophils: 47 %
RBC: 5.49 x10E6/uL (ref 4.14–5.80)
RDW: 12.6 % (ref 11.6–15.4)
WBC: 7.6 10*3/uL (ref 3.4–10.8)

## 2019-08-14 LAB — COMPREHENSIVE METABOLIC PANEL
ALT: 17 IU/L (ref 0–44)
AST: 18 IU/L (ref 0–40)
Albumin/Globulin Ratio: 2 (ref 1.2–2.2)
Albumin: 4.9 g/dL (ref 4.0–5.0)
Alkaline Phosphatase: 88 IU/L (ref 39–117)
BUN/Creatinine Ratio: 15 (ref 9–20)
BUN: 14 mg/dL (ref 6–24)
Bilirubin Total: 0.4 mg/dL (ref 0.0–1.2)
CO2: 24 mmol/L (ref 20–29)
Calcium: 10.2 mg/dL (ref 8.7–10.2)
Chloride: 103 mmol/L (ref 96–106)
Creatinine, Ser: 0.96 mg/dL (ref 0.76–1.27)
GFR calc Af Amer: 106 mL/min/{1.73_m2} (ref >59)
GFR calc non Af Amer: 92 mL/min/{1.73_m2} (ref >59)
Globulin, Total: 2.4 g/dL (ref 1.5–4.5)
Glucose: 91 mg/dL (ref 65–99)
Potassium: 4.4 mmol/L (ref 3.5–5.2)
Sodium: 142 mmol/L (ref 134–144)
Total Protein: 7.3 g/dL (ref 6.0–8.5)

## 2019-08-14 LAB — LIPID PANEL
Chol/HDL Ratio: 4.9 ratio (ref 0.0–5.0)
Cholesterol, Total: 238 mg/dL — ABNORMAL HIGH (ref 100–199)
HDL: 49 mg/dL (ref >39)
LDL Chol Calc (NIH): 160 mg/dL — ABNORMAL HIGH (ref 0–99)
Triglycerides: 158 mg/dL — ABNORMAL HIGH (ref 0–149)
VLDL Cholesterol Cal: 29 mg/dL (ref 5–40)

## 2019-08-14 LAB — HEMOGLOBIN A1C
Est. average glucose Bld gHb Est-mCnc: 117 mg/dL
Hgb A1c MFr Bld: 5.7 % — ABNORMAL HIGH (ref 4.8–5.6)

## 2019-08-14 LAB — TSH: TSH: 1.15 u[IU]/mL (ref 0.450–4.500)

## 2019-08-15 ENCOUNTER — Telehealth: Payer: Self-pay | Admitting: General Practice

## 2019-08-15 ENCOUNTER — Telehealth: Payer: Self-pay

## 2019-08-15 ENCOUNTER — Other Ambulatory Visit: Payer: Self-pay | Admitting: Family Medicine

## 2019-08-15 DIAGNOSIS — F410 Panic disorder [episodic paroxysmal anxiety] without agoraphobia: Secondary | ICD-10-CM

## 2019-08-15 MED ORDER — ALPRAZOLAM 0.25 MG PO TABS: 0.2500 mg | ORAL_TABLET | Freq: Every day | ORAL | 0 refills | Status: DC | PRN

## 2019-08-15 MED ORDER — ALPRAZOLAM 0.25 MG PO TBDP: 0.2500 mg | ORAL_TABLET | Freq: Every evening | ORAL | 0 refills | Status: DC | PRN

## 2019-08-15 NOTE — Telephone Encounter (Signed)
Requested medication (s) are due for refill today: Yes - Alprazolam 0.25 mg  Requested medication (s) are on the active medication list: Yes  Last refill:  08/13/19  Future visit scheduled: Yes  Notes to clinic:  Pharmacy is asking to substitute dissolvable with a tablet. [facility] [phone].    There are no medications in this encounter.

## 2019-08-15 NOTE — Telephone Encounter (Signed)
[facility] sent xanax 0.25 mg to [facility].  See previous message.

## 2019-08-15 NOTE — Telephone Encounter (Signed)
Pt is at pharmacy and wants someone to call and refill his meds   What is the name of the medication? ALPRAZolam (NIRAVAM) 0.25 MG dissolvable tablet     Have you contacted your pharmacy to request a refill? y  Which pharmacy would you like this sent to?  [id number] - [place], [place] - [address]  [address] [place] [address]  Phone:  [phone] Fax:  [phone]   Patient notified that their request is being sent to the clinical staff for review and that they should receive a call once it is complete. If they do not receive a call within 72 hours they can check with their pharmacy or our office.   Pt wants the tablet not the one that dissolves because it cost 6 time more

## 2019-08-15 NOTE — Telephone Encounter (Signed)
Pt came in and was asking about his medication going to tablets from [facility]. Talked to [facility] who talked to Dr. Noberto Retort and she was putting the tablets of medication into the pharmacy. Let pt know and he left for pharmacy.

## 2019-08-15 NOTE — Telephone Encounter (Signed)
Pt called back just left office calling from [facility] now / pt upset because meds still not available

## 2019-08-15 NOTE — Telephone Encounter (Signed)
Medication Refill - Medication: ALPRAZolam (NIRAVAM) 0.25 MG dissolvable tablet [[id number]]   They are wanting a different medciation . Please reach out to pharmacy   Preferred Pharmacy (with phone number or street name): [facility] [address] [place] - [address]  [address] [place] [address]  Phone: [phone] Fax: [phone]    Agent: Please be advised that RX refills may take up to 3 business days. We ask that you follow-up with your pharmacy.

## 2019-08-15 NOTE — Telephone Encounter (Signed)
[facility]alling sent medication over for pt.

## 2019-08-15 NOTE — Telephone Encounter (Signed)
Pt at pharmacy waiting for alprazolam 0.25 mgn #30 to be resent as regular tabs instead of the disolvable sent over by [facility] on 3/16.  Per pt the dissolvable cost 6 times more than the regular tabs and his anxiety is high and he needs this medication.  Advised Felicia to let pt know will sent to provider and call him back- pt advises no need to call him back he'll be waiting at the pharmacy. Pt uses [facility] on [address].  Contact # [phone]

## 2019-08-22 ENCOUNTER — Other Ambulatory Visit: Payer: Self-pay | Admitting: Registered Nurse

## 2019-08-22 DIAGNOSIS — E782 Mixed hyperlipidemia: Secondary | ICD-10-CM

## 2019-08-22 MED ORDER — ATORVASTATIN CALCIUM 40 MG PO TABS: 40.0000 mg | ORAL_TABLET | Freq: Every day | ORAL | 3 refills | Status: DC

## 2019-08-22 NOTE — Progress Notes (Signed)
Good morning,  If we could give Arthur Taylor a call to let him know his labs are back, and that his cholesterol was a little high, that would be great. We're going to start him on 40mg  atorvastatin to take every evening with dinner. We'll touch base about this again at his appt on 09/13/19  Thank you,  09/15/19, NP

## 2019-08-23 NOTE — Progress Notes (Signed)
Pt has been made aware of starting cholesterol medication and upcoming appt 09/13/2019

## 2019-09-13 ENCOUNTER — Ambulatory Visit: Payer: BLUE CROSS/BLUE SHIELD | Admitting: Registered Nurse

## 2020-03-06 ENCOUNTER — Ambulatory Visit: Payer: BLUE CROSS/BLUE SHIELD | Attending: Internal Medicine

## 2020-03-06 DIAGNOSIS — Z23 Encounter for immunization: Secondary | ICD-10-CM

## 2020-03-06 NOTE — Progress Notes (Signed)
   Covid-19 Vaccination Clinic  Name:  Arthur Taylor    MRN: [id number] DOB: [birth year]  03/06/2020  [name] was observed post Covid-19 immunization for 15 minutes without incident. He was provided with Vaccine Information Sheet and instruction to access the V-Safe system.   [name] was instructed to call 911 with any severe reactions post vaccine: Marland Kitchen Difficulty breathing  . Swelling of face and throat  . A fast heartbeat  . A bad rash all over body  . Dizziness and weakness   Immunizations Administered    Name Date Dose VIS Date Route   Pfizer COVID-19 Vaccine 03/06/2020  9:39 AM 0.3 mL 07/24/2018 Intramuscular   Manufacturer: [employer], [employer]   Lot: [id number]   NDC: [address]-5

## 2020-03-27 ENCOUNTER — Ambulatory Visit: Payer: BLUE CROSS/BLUE SHIELD | Attending: Internal Medicine

## 2020-03-27 DIAGNOSIS — Z23 Encounter for immunization: Secondary | ICD-10-CM

## 2020-03-27 NOTE — Progress Notes (Signed)
   Covid-19 Vaccination Clinic  Name:  [name]    MRN: [id number] DOB: [birth year]  03/27/2020  [name] was observed post Covid-19 immunization for 15 minutes without incident. He was provided with Vaccine Information Sheet and instruction to access the V-Safe system.   [name] was instructed to call 911 with any severe reactions post vaccine: Marland Kitchen Difficulty breathing  . Swelling of face and throat  . A fast heartbeat  . A bad rash all over body  . Dizziness and weakness   Immunizations Administered    Name Date Dose VIS Date Route   Pfizer COVID-19 Vaccine 03/27/2020  9:11 AM 0.3 mL 03/18/2020 Intramuscular   Manufacturer: [employer], [employer]   Lot: [id number]   NDC: [address]-4

## 2021-01-18 ENCOUNTER — Other Ambulatory Visit: Payer: Self-pay | Admitting: Registered Nurse

## 2021-01-18 DIAGNOSIS — F410 Panic disorder [episodic paroxysmal anxiety] without agoraphobia: Secondary | ICD-10-CM

## 2021-05-18 ENCOUNTER — Ambulatory Visit
Admission: EM | Admit: 2021-05-18 | Discharge: 2021-05-18 | Disposition: A | Discharge status: Home / Self Care | Payer: BC Managed Care – PPO | Attending: Physician Assistant | Admitting: Physician Assistant

## 2021-05-18 ENCOUNTER — Encounter: Payer: Self-pay | Admitting: Emergency Medicine

## 2021-05-18 DIAGNOSIS — F419 Anxiety disorder, unspecified: Secondary | ICD-10-CM

## 2021-05-18 NOTE — ED Triage Notes (Signed)
Patient states that he is having some issues with anxiety that comes and goes.  He does have some life stressors with his parents being sick.  About 2 years ago he was prescribed Xanax 0.25mg  and another prescription unsure of the name of it.  Left sided chest discomfort that comes and goes when he does feel anxious.  No PCP.

## 2021-05-18 NOTE — ED Provider Notes (Signed)
[name]    CSN: [id number] Arrival date & time: 05/18/21  1516      History   Chief Complaint Chief Complaint  Patient presents with   Anxiety    HPI Nayef College is a 50 y.o. male.   Patient here today for evaluation of what is most likely due to anxiety.  He states that he has been having some left-sided chest discomfort at times when he feels anxious.  His chest discomfort seems to be worse when he is at rest and better when he has active.  He denies any depression or thoughts of wanting to hurt himself.  He was previously prescribed medication for anxiety including alprazolam.  He reports he has also had some episodes of feeling really warm.  The history is provided by the patient.   History reviewed. No pertinent past medical history.  Patient Active Problem List   Diagnosis Date Noted   Panic attacks 08/13/2019   Verrucous skin lesion 06/15/2018    Past Surgical History:  Procedure Laterality Date   KNEE SURGERY  1988       Home Medications    Prior to Admission medications   Medication Sig Start Date End Date Taking? Authorizing Provider  ALPRAZolam (XANAX) 0.25 MG tablet Take 1 tablet (0.25 mg total) by mouth daily as needed for anxiety. 08/15/19   Doristine Bosworth, MD  atorvastatin (LIPITOR) 40 MG tablet Take 1 tablet (40 mg total) by mouth daily. 08/22/19   Janeece Agee, NP  busPIRone (BUSPAR) 7.5 MG tablet Take 1 tablet (7.5 mg total) by mouth 3 (three) times daily. 08/13/19   Janeece Agee, NP  omeprazole (PRILOSEC) 20 MG capsule Take 20 mg by mouth daily as needed (ACID REFLUX).    [provider]    Family History No family history on file.  Social History Social History   Tobacco Use   Smoking status: Never   Smokeless tobacco: Never  Vaping Use   Vaping Use: Never used  Substance Use Topics   Alcohol use: Yes    Alcohol/week: 3.0 standard drinks    Types: 3 Standard drinks or equivalent per week    Drug use: Yes    Types: Marijuana     Allergies   Lactose intolerance (gi)   Review of Systems Review of Systems  Constitutional:  Negative for chills and fever.  Eyes:  Negative for discharge and redness.  Respiratory:  Positive for chest tightness. Negative for shortness of breath.   Skin:  Positive for color change and wound.  Neurological:  Negative for numbness.  Psychiatric/Behavioral:  Negative for suicidal ideas. The patient is nervous/anxious.     Physical Exam Triage Vital Signs ED Triage Vitals [05/18/21 1528]  Enc Vitals Group     BP (!) 154/102     Pulse Rate 64     Resp      Temp 98.3 F (36.8 C)     Temp Source Oral     SpO2 98 %     Weight 195 lb (88.5 kg)     Height 6' (1.829 m)     Head Circumference      Peak Flow      Pain Score 0     Pain Loc      Pain Edu?      Excl. in GC?    No data found.  Updated Vital Signs BP (!) 154/102 (BP Location: Left Arm)    Pulse 64  Temp 98.3 F (36.8 C) (Oral)    Ht 6' (1.829 m)    Wt 195 lb (88.5 kg)    SpO2 98%    BMI 26.45 kg/m     Physical Exam Vitals and nursing note reviewed.  Constitutional:      General: He is not in acute distress.    Appearance: Normal appearance. He is not ill-appearing.  HENT:     Head: Normocephalic and atraumatic.  Eyes:     Conjunctiva/sclera: Conjunctivae normal.  Cardiovascular:     Rate and Rhythm: Normal rate and regular rhythm.     Heart sounds: Normal heart sounds. No murmur heard. Pulmonary:     Effort: Pulmonary effort is normal. No respiratory distress.     Breath sounds: Normal breath sounds. No wheezing, rhonchi or rales.  Neurological:     Mental Status: He is alert.  Psychiatric:        Mood and Affect: Mood normal.        Behavior: Behavior normal.        Thought Content: Thought content normal.     UC Treatments / Results  Labs (all labs ordered are listed, but only abnormal results are displayed) Labs Reviewed  CBC WITH  DIFFERENTIAL/PLATELET  COMPREHENSIVE METABOLIC PANEL  TSH    EKG   Radiology No results found.  Procedures Procedures (including critical care time)  Medications Ordered in UC Medications - No data to display  Initial Impression / Assessment and Plan / UC Course  I have reviewed the triage vital signs and the nursing notes.  Pertinent labs & imaging results that were available during my care of the patient were reviewed by me and considered in my medical decision making (see chart for details).    EKG with normal sinus rhythm.  I suspect symptoms are most likely were related to anxiety as well and recommended further evaluation at behavioral health urgent care for refills of his medications.  I did order CBC and CMP as well as TSH given complaints.  We will set patient up with PCP as well.  Encouraged follow-up with any further concerns.  Final Clinical Impressions(s) / UC Diagnoses   Final diagnoses:  Anxiety     Discharge Instructions      EKG looks great!! Check your MyChart for lab results. Follow up with any further concerns.     ED Prescriptions   None    PDMP not reviewed this encounter.   Tomi Bamberger, PA-C 05/18/21 1723

## 2021-05-18 NOTE — Discharge Instructions (Signed)
EKG looks great!! Check your MyChart for lab results. Follow up with any further concerns.

## 2021-05-19 LAB — CBC WITH DIFFERENTIAL/PLATELET
Basophils Absolute: 0.1 10*3/uL (ref 0.0–0.2)
Basos: 1 %
EOS (ABSOLUTE): 0.3 10*3/uL (ref 0.0–0.4)
Eos: 4 %
Hematocrit: 46 % (ref 37.5–51.0)
Hemoglobin: 15.9 g/dL (ref 13.0–17.7)
Immature Grans (Abs): 0 10*3/uL (ref 0.0–0.1)
Immature Granulocytes: 0 %
Lymphocytes Absolute: 2.7 10*3/uL (ref 0.7–3.1)
Lymphs: 34 %
MCH: 30 pg (ref 26.6–33.0)
MCHC: 34.6 g/dL (ref 31.5–35.7)
MCV: 87 fL (ref 79–97)
Monocytes Absolute: 0.8 10*3/uL (ref 0.1–0.9)
Monocytes: 10 %
Neutrophils Absolute: 4 10*3/uL (ref 1.4–7.0)
Neutrophils: 51 %
Platelets: 247 10*3/uL (ref 150–450)
RBC: 5.3 x10E6/uL (ref 4.14–5.80)
RDW: 12.7 % (ref 11.6–15.4)
WBC: 7.8 10*3/uL (ref 3.4–10.8)

## 2021-05-19 LAB — COMPREHENSIVE METABOLIC PANEL
ALT: 15 IU/L (ref 0–44)
AST: 15 IU/L (ref 0–40)
Albumin/Globulin Ratio: 2.5 — ABNORMAL HIGH (ref 1.2–2.2)
Albumin: 4.9 g/dL (ref 3.8–4.9)
Alkaline Phosphatase: 85 IU/L (ref 44–121)
BUN/Creatinine Ratio: 19 (ref 9–20)
BUN: 17 mg/dL (ref 6–24)
Bilirubin Total: 0.8 mg/dL (ref 0.0–1.2)
CO2: 25 mmol/L (ref 20–29)
Calcium: 10.1 mg/dL (ref 8.7–10.2)
Chloride: 103 mmol/L (ref 96–106)
Creatinine, Ser: 0.9 mg/dL (ref 0.76–1.27)
Globulin, Total: 2 g/dL (ref 1.5–4.5)
Glucose: 94 mg/dL (ref 70–99)
Potassium: 4.4 mmol/L (ref 3.5–5.2)
Sodium: 141 mmol/L (ref 134–144)
Total Protein: 6.9 g/dL (ref 6.0–8.5)
eGFR: 103 mL/min/{1.73_m2} (ref >59)

## 2021-05-19 LAB — TSH: TSH: 1.04 u[IU]/mL (ref 0.450–4.500)

## 2021-07-01 ENCOUNTER — Encounter: Payer: Self-pay | Admitting: Family Medicine

## 2021-07-01 ENCOUNTER — Other Ambulatory Visit: Payer: Self-pay

## 2021-07-01 ENCOUNTER — Ambulatory Visit: Payer: 59 | Admitting: Family Medicine

## 2021-07-01 VITALS — BP 127/83 | HR 61 | Temp 97.5°F | Resp 16 | Ht 73.0 in | Wt 206.6 lb

## 2021-07-01 DIAGNOSIS — E782 Mixed hyperlipidemia: Secondary | ICD-10-CM

## 2021-07-01 DIAGNOSIS — Z7689 Persons encountering health services in other specified circumstances: Secondary | ICD-10-CM | POA: Diagnosis not present

## 2021-07-01 DIAGNOSIS — K219 Gastro-esophageal reflux disease without esophagitis: Secondary | ICD-10-CM | POA: Diagnosis not present

## 2021-07-01 DIAGNOSIS — F419 Anxiety disorder, unspecified: Secondary | ICD-10-CM

## 2021-07-01 NOTE — Progress Notes (Signed)
Patient is here to establish care. Patient is a little concern about his EKG from December.  Patient said that he gets seasonal anxiety. Patient said this has happen since taking COVID vaccine

## 2021-07-01 NOTE — Progress Notes (Signed)
New Patient Office Visit  Subjective:  Patient ID: Arthur Taylor, male    DOB: Jul 22, 1970  Age: 51 y.o. MRN: [id number]  CC:  Chief Complaint  Patient presents with   Establish Care    HPI Arthur Taylor presents for to establish care. Patient reports history of anxiety. He reports that he has been taking meds primarily from November  Past Medical History:  Diagnosis Date   Anxiety     Past Surgical History:  Procedure Laterality Date   KNEE SURGERY  1988    History reviewed. No pertinent family history.  Social History   Socioeconomic History   Marital status: Single    Spouse name: Not on file   Number of children: Not on file   Years of education: Not on file   Highest education level: Not on file  Occupational History   Not on file  Tobacco Use   Smoking status: Never   Smokeless tobacco: Never  Vaping Use   Vaping Use: Never used  Substance and Sexual Activity   Alcohol use: Yes    Alcohol/week: 3.0 standard drinks    Types: 3 Standard drinks or equivalent per week   Drug use: Yes    Types: Marijuana   Sexual activity: Yes  Other Topics Concern   Not on file  Social History Narrative   Not on file   Social Determinants of Health   Financial Resource Strain: Not on file  Food Insecurity: Not on file  Transportation Needs: Not on file  Physical Activity: Not on file  Stress: Not on file  Social Connections: Not on file  Intimate Partner Violence: Not on file    ROS Review of Systems  Psychiatric/Behavioral:  Positive for sleep disturbance. Negative for self-injury and suicidal ideas. The patient is nervous/anxious.   All other systems reviewed and are negative.  Objective:   Today's Vitals: BP 127/83    Pulse 61    Temp (!) 97.5 F (36.4 C) (Oral)    Resp 16    Ht 6\' 1"  (1.854 m)    Wt 206 lb 9.6 oz (93.7 kg)    SpO2 97%    BMI 27.26 kg/m   Physical Exam Vitals and nursing note reviewed.  Constitutional:      General:  He is not in acute distress. Cardiovascular:     Rate and Rhythm: Normal rate and regular rhythm.  Pulmonary:     Effort: Pulmonary effort is normal.     Breath sounds: Normal breath sounds.  Abdominal:     Palpations: Abdomen is soft.     Tenderness: There is no abdominal tenderness.  Neurological:     General: No focal deficit present.     Mental Status: He is alert and oriented to person, place, and time.  Psychiatric:        Mood and Affect: Mood is anxious.        Behavior: Behavior normal.    Assessment & Plan:   1. Anxiety Patient defers med management. Is taking vitamin supplements that have appeared to him to be very helpful and decreasing/resolving sx.   2. Mixed hyperlipidemia Patient has deferred starting med ever  3. Gastroesophageal reflux disease without esophagitis Appears stable. Continue meds prn  4. Encounter to establish care   Outpatient Encounter Medications as of 07/01/2021  Medication Sig   ALPRAZolam (XANAX) 0.25 MG tablet Take 1 tablet (0.25 mg total) by mouth daily as needed for anxiety.   busPIRone (BUSPAR)  7.5 MG tablet Take 1 tablet (7.5 mg total) by mouth 3 (three) times daily.   omeprazole (PRILOSEC) 20 MG capsule Take 20 mg by mouth daily as needed (ACID REFLUX).   atorvastatin (LIPITOR) 40 MG tablet Take 1 tablet (40 mg total) by mouth daily. (Patient not taking: Reported on 07/01/2021)   hydrOXYzine (VISTARIL) 25 MG capsule Take 25 mg by mouth 3 (three) times daily.   No facility-administered encounter medications on file as of 07/01/2021.    Follow-up: No follow-ups on file.   Tommie Raymond, MD

## 2021-07-02 ENCOUNTER — Encounter: Payer: Self-pay | Admitting: Family Medicine

## 2021-07-12 ENCOUNTER — Telehealth: Payer: Self-pay | Admitting: Family Medicine

## 2021-07-12 NOTE — Telephone Encounter (Signed)
I called the patient to discuss his lab results performed from his urgent care visit in 04/2021 as well as his EKG and answered his questions.  He was satisfied at the end of the conversation.

## 2021-07-12 NOTE — Telephone Encounter (Signed)
I called him to go over his ED labs and EKG per his patient complaint but he stated he was at work and could not speak at this time.  He will call the clinic back with a better time.

## 2023-03-11 ENCOUNTER — Encounter: Payer: Self-pay | Admitting: *Deleted

## 2023-03-11 ENCOUNTER — Ambulatory Visit
Admission: EM | Admit: 2023-03-11 | Discharge: 2023-03-11 | Disposition: A | Discharge status: Home / Self Care | Payer: Self-pay | Attending: Physician Assistant | Admitting: Physician Assistant

## 2023-03-11 ENCOUNTER — Other Ambulatory Visit: Payer: Self-pay

## 2023-03-11 DIAGNOSIS — L309 Dermatitis, unspecified: Secondary | ICD-10-CM

## 2023-03-11 MED ORDER — METHYLPREDNISOLONE ACETATE 80 MG/ML IJ SUSP
80.0000 mg | Freq: Once | INTRAMUSCULAR | Status: AC
  Administered 2023-03-11: 80 mg via INTRAMUSCULAR

## 2023-03-11 NOTE — ED Triage Notes (Signed)
Pt reports itchy rash to arms x 5 days

## 2023-03-27 NOTE — ED Provider Notes (Signed)
[name]    CSN: [id number] Arrival date & time: 03/11/23  0915      History   Chief Complaint Chief Complaint  Patient presents with   Rash    HPI Lorren Commander is a 52 y.o. male.   Patient here today for evaluation of rash to both his arms that has been present the last several days. Rash is itchy. He denies any known exposure. He has not had any shortness of breath, trouble swallowing. OTC treatment has not been effective.   The history is provided by the patient.  Rash Associated symptoms: no fever and no shortness of breath     Past Medical History:  Diagnosis Date   Anxiety     Patient Active Problem List   Diagnosis Date Noted   Panic attacks 08/13/2019   Verrucous skin lesion 06/15/2018    Past Surgical History:  Procedure Laterality Date   KNEE SURGERY  1988       Home Medications    Prior to Admission medications   Medication Sig Start Date End Date Taking? Authorizing Provider  ALPRAZolam (XANAX) 0.25 MG tablet Take 1 tablet (0.25 mg total) by mouth daily as needed for anxiety. 08/15/19   Doristine Bosworth, MD  atorvastatin (LIPITOR) 40 MG tablet Take 1 tablet (40 mg total) by mouth daily. Patient not taking: Reported on 07/01/2021 08/22/19   Janeece Agee, NP  busPIRone (BUSPAR) 7.5 MG tablet Take 1 tablet (7.5 mg total) by mouth 3 (three) times daily. 08/13/19   Janeece Agee, NP  hydrOXYzine (VISTARIL) 25 MG capsule Take 25 mg by mouth 3 (three) times daily. 05/20/21   [provider]  omeprazole (PRILOSEC) 20 MG capsule Take 20 mg by mouth daily as needed (ACID REFLUX).    [provider]    Family History History reviewed. No pertinent family history.  Social History Social History   Tobacco Use   Smoking status: Never   Smokeless tobacco: Never  Vaping Use   Vaping status: Never Used  Substance Use Topics   Alcohol use: Yes    Alcohol/week: 3.0 standard drinks of alcohol    Types: 3  Standard drinks or equivalent per week    Comment: rare   Drug use: Yes    Types: Marijuana     Allergies   Lactose intolerance (gi)   Review of Systems Review of Systems  Constitutional:  Negative for chills and fever.  HENT:  Negative for trouble swallowing.   Eyes:  Negative for discharge and redness.  Respiratory:  Negative for shortness of breath.   Skin:  Positive for rash.  Neurological:  Negative for numbness.     Physical Exam Triage Vital Signs ED Triage Vitals  Encounter Vitals Group     BP 03/11/23 0935 128/87     Systolic BP Percentile --      Diastolic BP Percentile --      Pulse Rate 03/11/23 0935 61     Resp 03/11/23 0935 18     Temp 03/11/23 0935 98.1 F (36.7 C)     Temp Source 03/11/23 0935 Oral     SpO2 03/11/23 0935 98 %     Weight 03/11/23 0933 205 lb (93 kg)     Height 03/11/23 0933 6' (1.829 m)     Head Circumference --      Peak Flow --      Pain Score 03/11/23 0933 0     Pain Loc --  Pain Education --      Exclude from Growth Chart --    No data found.  Updated Vital Signs BP 128/87 (BP Location: Left Arm)   Pulse 61   Temp 98.1 F (36.7 C) (Oral)   Resp 18   Ht 6' (1.829 m)   Wt 205 lb (93 kg)   SpO2 98%   BMI 27.80 kg/m      Physical Exam Vitals and nursing note reviewed.  Constitutional:      General: He is not in acute distress.    Appearance: Normal appearance. He is not ill-appearing.  HENT:     Head: Normocephalic and atraumatic.  Eyes:     Conjunctiva/sclera: Conjunctivae normal.  Cardiovascular:     Rate and Rhythm: Normal rate.  Pulmonary:     Effort: Pulmonary effort is normal.  Abdominal:     Palpations: Abdomen is soft.  Skin:    Comments: Mildly erythematous papular eruption to bilateral arms  Neurological:     Mental Status: He is alert.  Psychiatric:        Mood and Affect: Mood normal.        Behavior: Behavior normal.        Thought Content: Thought content normal.      UC Treatments  / Results  Labs (all labs ordered are listed, but only abnormal results are displayed) Labs Reviewed - No data to display  EKG   Radiology No results found.  Procedures Procedures (including critical care time)  Medications Ordered in UC Medications  methylPREDNISolone acetate (DEPO-MEDROL) injection 80 mg (80 mg Intramuscular Given 03/11/23 0946)    Initial Impression / Assessment and Plan / UC Course  I have reviewed the triage vital signs and the nursing notes.  Pertinent labs & imaging results that were available during my care of the patient were reviewed by me and considered in my medical decision making (see chart for details).    Unclear cause of rash but seems consistent with contact dermatitis of some sort. Will trial steroid injection in office and recommended follow up if symptoms do not improve or worsen in any way.   Final Clinical Impressions(s) / UC Diagnoses   Final diagnoses:  Dermatitis   Discharge Instructions   None    ED Prescriptions   None    PDMP not reviewed this encounter.   Tomi Bamberger, PA-C 03/27/23 1753

## 2023-09-15 ENCOUNTER — Ambulatory Visit
Admission: EM | Admit: 2023-09-15 | Discharge: 2023-09-15 | Disposition: A | Discharge status: Home / Self Care | Payer: Self-pay | Attending: Internal Medicine | Admitting: Internal Medicine

## 2023-09-15 ENCOUNTER — Encounter: Payer: Self-pay | Admitting: Emergency Medicine

## 2023-09-15 DIAGNOSIS — R030 Elevated blood-pressure reading, without diagnosis of hypertension: Secondary | ICD-10-CM

## 2023-09-15 DIAGNOSIS — R21 Rash and other nonspecific skin eruption: Secondary | ICD-10-CM

## 2023-09-15 MED ORDER — DOXYCYCLINE HYCLATE 100 MG PO CAPS: 100.0000 mg | ORAL_CAPSULE | Freq: Two times a day (BID) | ORAL | 0 refills | Status: DC

## 2023-09-15 NOTE — Discharge Instructions (Signed)
 Antibiotic prescribed for you today given concern for infection. Take with food. Monitor blood pressure at home.

## 2023-09-15 NOTE — ED Triage Notes (Addendum)
 Pt reports R upper thigh swelling x3 days. Pt believes he had a bug bite while at an airbnb that started the swelling. Large area of redness and slight swelling to area. Does not extend into groin area. Pt has not applied creams to the area or used meds other than tylenol . Pt reports this has happened twice to the same area in the last 36yrs but he believes the others were also bug bites. Denies pain and fevers. Mild itching to area.

## 2023-09-15 NOTE — ED Provider Notes (Signed)
 [name]    CSN: [id number] Arrival date & time: 09/15/23  1430      History   Chief Complaint Chief Complaint  Patient presents with   Leg Swelling    HPI Arthur Taylor is a 53 y.o. male.   Patient presents with right upper thigh swelling and redness that started about 3 days ago.  Patient reports that he noticed an area that looked similar to a bug bite after staying in an airbnb.  Then, the area of redness got bigger.  He reports that this has happened 3 times in the span of 12 years but is not sure if each episode is related.  Previous episodes resolved on their own. Denies fever, chills, body aches, nausea, vomiting.  Denies any obvious injury, insect bite, spider bite.     Past Medical History:  Diagnosis Date   Anxiety     Patient Active Problem List   Diagnosis Date Noted   Panic attacks 08/13/2019   Verrucous skin lesion 06/15/2018    Past Surgical History:  Procedure Laterality Date   KNEE SURGERY  1988       Home Medications    Prior to Admission medications   Medication Sig Start Date End Date Taking? Authorizing Provider  doxycycline  (VIBRAMYCIN ) 100 MG capsule Take 1 capsule (100 mg total) by mouth 2 (two) times daily. 09/15/23  Yes Corrinna Karapetyan, Darryle BRAVO, FNP  ALPRAZolam  (XANAX ) 0.25 MG tablet Take 1 tablet (0.25 mg total) by mouth daily as needed for anxiety. Patient not taking: Reported on 09/15/2023 08/15/19   Stallings, Zoe A, MD  atorvastatin  (LIPITOR) 40 MG tablet Take 1 tablet (40 mg total) by mouth daily. Patient not taking: Reported on 07/01/2021 08/22/19   Kip Ade, NP  busPIRone  (BUSPAR ) 7.5 MG tablet Take 1 tablet (7.5 mg total) by mouth 3 (three) times daily. Patient not taking: Reported on 09/15/2023 08/13/19   Kip Ade, NP  hydrOXYzine (VISTARIL) 25 MG capsule Take 25 mg by mouth 3 (three) times daily. Patient not taking: Reported on 09/15/2023 05/20/21   [provider]  omeprazole (PRILOSEC) 20 MG  capsule Take 20 mg by mouth daily as needed (ACID REFLUX). Patient not taking: Reported on 09/15/2023    [provider]    Family History History reviewed. No pertinent family history.  Social History Social History   Tobacco Use   Smoking status: Never   Smokeless tobacco: Never  Vaping Use   Vaping status: Never Used  Substance Use Topics   Alcohol use: Yes    Alcohol/week: 3.0 standard drinks of alcohol    Types: 3 Standard drinks or equivalent per week    Comment: rare   Drug use: Yes    Types: Marijuana     Allergies   Lactose intolerance (gi)   Review of Systems Review of Systems Per HPI  Physical Exam Triage Vital Signs ED Triage Vitals  Encounter Vitals Group     BP 09/15/23 1649 (!) 152/102     Systolic BP Percentile --      Diastolic BP Percentile --      Pulse Rate 09/15/23 1649 74     Resp 09/15/23 1649 14     Temp 09/15/23 1649 97.9 F (36.6 C)     Temp Source 09/15/23 1649 Oral     SpO2 09/15/23 1649 96 %     Weight --      Height --      Head Circumference --  Peak Flow --      Pain Score 09/15/23 1645 0     Pain Loc --      Pain Education --      Exclude from Growth Chart --    No data found.  Updated Vital Signs BP (!) 152/102 (BP Location: Right Arm)   Pulse 74   Temp 97.9 F (36.6 C) (Oral)   Resp 14   SpO2 96%   Visual Acuity Right Eye Distance:   Left Eye Distance:   Bilateral Distance:    Right Eye Near:   Left Eye Near:    Bilateral Near:     Physical Exam Skin:         Comments: Patient has area of erythema present to right thigh.  No obvious area of swelling or noticeable insect or spider bite.  No obvious lymph node swelling noted.      UC Treatments / Results  Labs (all labs ordered are listed, but only abnormal results are displayed) Labs Reviewed - No data to display  EKG   Radiology No results found.  Procedures Procedures (including critical care time)  Medications Ordered in  UC Medications - No data to display  Initial Impression / Assessment and Plan / UC Course  I have reviewed the triage vital signs and the nursing notes.  Pertinent labs & imaging results that were available during my care of the patient were reviewed by me and considered in my medical decision making (see chart for details).     Differential diagnoses include candidiasis of the skin versus cellulitis.  Area of redness is not consistent with candidiasis but I am concerned for cellulitis.  There is no obvious bug bite or spider bite notable which could explain symptoms.  Although, will opt to treat with doxycycline  given how diffuse redness is.  Patient is not having any systemic symptoms or fever so do not think additional blood work or emergency department evaluation is necessary.  Patient encouraged to monitor and follow-up if it worsens. Encouraged follow up with PCP given this may be a recurrent issue.  Blood pressure slightly elevated and patient denies history of hypertension.  Encouraged patient to monitor blood pressure at home as previous blood pressures have been unremarkable.  No signs of hypertensive urgency on exam.  Patient verbalized understanding and was agreeable with plan. Final Clinical Impressions(s) / UC Diagnoses   Final diagnoses:  Rash and nonspecific skin eruption  Elevated blood pressure reading     Discharge Instructions      Antibiotic prescribed for you today given concern for infection. Take with food. Monitor blood pressure at home.     ED Prescriptions     Medication Sig Dispense Auth. Provider   doxycycline  (VIBRAMYCIN ) 100 MG capsule Take 1 capsule (100 mg total) by mouth 2 (two) times daily. 20 capsule Gerrick Ray E, [place]      PDMP not reviewed this encounter.   Hazen Darryle BRAVO, [place] 09/15/23 [phone]

## 2023-09-16 ENCOUNTER — Other Ambulatory Visit: Payer: Self-pay

## 2023-09-16 ENCOUNTER — Encounter: Payer: Self-pay | Admitting: *Deleted

## 2023-09-16 ENCOUNTER — Ambulatory Visit
Admission: EM | Admit: 2023-09-16 | Discharge: 2023-09-16 | Disposition: A | Discharge status: Home / Self Care | Payer: Self-pay | Attending: Family Medicine | Admitting: Family Medicine

## 2023-09-16 DIAGNOSIS — R21 Rash and other nonspecific skin eruption: Secondary | ICD-10-CM

## 2023-09-16 MED ORDER — PREDNISONE 20 MG PO TABS: 40.0000 mg | ORAL_TABLET | Freq: Every day | ORAL | 0 refills | Status: AC

## 2023-09-16 MED ORDER — AMOXICILLIN-POT CLAVULANATE 875-125 MG PO TABS: 1.0000 | ORAL_TABLET | Freq: Two times a day (BID) | ORAL | 0 refills | Status: AC

## 2023-09-16 NOTE — ED Triage Notes (Signed)
 Pt seen yesterday for redness to right upper thigh. He was started on doxycycline  yesterday but believes he is having an allergic reaction to the antibiotic with rash to both upper arms

## 2023-09-18 NOTE — ED Provider Notes (Signed)
 [facility]   [id number] 09/16/23 Arrival Time: 1214  ASSESSMENT & PLAN:  1. Rash and nonspecific skin eruption    Stop doxy; possible rxn.  Begin: Meds ordered this encounter  Medications   amoxicillin -clavulanate (AUGMENTIN ) 875-125 MG tablet    Sig: Take 1 tablet by mouth every 12 (twelve) hours.    Dispense:  14 tablet    Refill:  0   predniSONE  (DELTASONE ) 20 MG tablet    Sig: Take 2 tablets (40 mg total) by mouth daily.    Dispense:  10 tablet    Refill:  0    Follow-up Information     Senaida Dama, NP.   Specialty: Nurse Practitioner Why: If worsening or failing to improve as anticipated. Contact information: [address] [place] [address] [phone]         Schedule an appointment as soon as possible for a visit  with Senaida Dama, NP.   Specialty: Nurse Practitioner Why: To recheck your blood pressure. Contact information: [address] [place] [address] [phone]                  Will follow up with PCP or here if worsening or failing to improve as anticipated. Reviewed expectations re: course of current medical issues. Questions answered. Outlined signs and symptoms indicating need for more acute intervention. Patient verbalized understanding. After Visit Summary given.   SUBJECTIVE:  Arthur Taylor is a 53 y.o. male who presents with a skin complaint. Pt seen yesterday for redness to right upper thigh. He was started on doxycycline  yesterday but believes he is having an allergic reaction to the antibiotic with rash to both upper arms    OBJECTIVE: Vitals:   09/16/23 1253  BP: (!) 148/104  Pulse: 93  Resp: 18  Temp: 98 F (36.7 C)  TempSrc: Oral  SpO2: 100%    General appearance: alert; no distress HEENT: [place]; AT Neck: supple with FROM Lungs: clear to auscultation bilaterally Heart: regular rate and rhythm Extremities: no edema; moves all extremities  normally Skin: warm and dry; red and hot very large area over R inner thigh without areas of fluctuance; skin marker marking visible; redness just slightly beyond; fine macular rash on upper extremities that is new Psychological: alert and cooperative; normal mood and affect  Allergies  Allergen Reactions   Lactose Intolerance (Gi) Other (See Comments)    gas   Doxycycline  Rash    Past Medical History:  Diagnosis Date   Anxiety    Social History   Socioeconomic History   Marital status: Single    Spouse name: Not on file   Number of children: Not on file   Years of education: Not on file   Highest education level: Not on file  Occupational History   Not on file  Tobacco Use   Smoking status: Never   Smokeless tobacco: Never  Vaping Use   Vaping status: Never Used  Substance and Sexual Activity   Alcohol use: Yes    Alcohol/week: 3.0 standard drinks of alcohol    Types: 3 Standard drinks or equivalent per week    Comment: rare   Drug use: Yes    Types: Marijuana   Sexual activity: Yes  Other Topics Concern   Not on file  Social History Narrative   Not on file   Social Drivers of Health   Financial Resource Strain: Not on file  Food Insecurity: Not on file  Transportation Needs: Not on file  Physical Activity: Not on file  Stress: Not on file  Social Connections: Not on file  Intimate Partner Violence: Not on file   History reviewed. No pertinent family history. Past Surgical History:  Procedure Laterality Date   KNEE SURGERY  1988      Afton Albright, MD 09/18/23 1053
